# Patient Record
Sex: Female | Born: 1975 | Hispanic: No | Marital: Single
Health system: Western US, Academic
[De-identification: ages and names within clinical notes are randomized; demographics above are authoritative.]

## PROBLEM LIST (undated history)

## (undated) DIAGNOSIS — R87619 Unspecified abnormal cytological findings in specimens from cervix uteri: Secondary | ICD-10-CM

## (undated) DIAGNOSIS — E041 Nontoxic single thyroid nodule: Secondary | ICD-10-CM

## (undated) DIAGNOSIS — IMO0002 Reserved for concepts with insufficient information to code with codable children: Secondary | ICD-10-CM

## (undated) DIAGNOSIS — Z87440 Personal history of urinary (tract) infections: Secondary | ICD-10-CM

## (undated) DIAGNOSIS — F32A Depression, unspecified: Secondary | ICD-10-CM

## (undated) DIAGNOSIS — O09529 Supervision of elderly multigravida, unspecified trimester: Secondary | ICD-10-CM

## (undated) DIAGNOSIS — D649 Anemia, unspecified: Secondary | ICD-10-CM

## (undated) DIAGNOSIS — Z8619 Personal history of other infectious and parasitic diseases: Secondary | ICD-10-CM

## (undated) DIAGNOSIS — J302 Other seasonal allergic rhinitis: Secondary | ICD-10-CM

## (undated) DIAGNOSIS — Z8719 Personal history of other diseases of the digestive system: Secondary | ICD-10-CM

## (undated) DIAGNOSIS — K6289 Other specified diseases of anus and rectum: Secondary | ICD-10-CM

## (undated) DIAGNOSIS — F419 Anxiety disorder, unspecified: Secondary | ICD-10-CM

## (undated) DIAGNOSIS — K625 Hemorrhage of anus and rectum: Secondary | ICD-10-CM

## (undated) HISTORY — DX: Personal history of other infectious and parasitic diseases: Z86.19

## (undated) HISTORY — DX: Supervision of elderly multigravida, unspecified trimester: O09.529

## (undated) HISTORY — DX: Personal history of other diseases of the digestive system: Z87.19

## (undated) HISTORY — DX: Anxiety disorder, unspecified: F41.9

## (undated) HISTORY — DX: Unspecified abnormal cytological findings in specimens from cervix uteri: R87.619

## (undated) HISTORY — DX: Depression, unspecified: F32.A

## (undated) HISTORY — DX: Other specified diseases of anus and rectum: K62.89

## (undated) HISTORY — DX: Hemorrhage of anus and rectum: K62.5

## (undated) HISTORY — DX: Anemia, unspecified: D64.9

## (undated) HISTORY — DX: Personal history of urinary (tract) infections: Z87.440

## (undated) HISTORY — DX: Other seasonal allergic rhinitis: J30.2

## (undated) HISTORY — DX: Reserved for concepts with insufficient information to code with codable children: IMO0002

---

## 2005-09-21 HISTORY — PX: WISDOM TOOTH EXTRACTION: SHX21

## 2007-09-22 DIAGNOSIS — E041 Nontoxic single thyroid nodule: Secondary | ICD-10-CM

## 2007-09-22 HISTORY — DX: Nontoxic single thyroid nodule: E04.1

## 2008-04-09 ENCOUNTER — Encounter: Admission: RE | Admit: 2008-04-09 | Discharge: 2008-04-09 | Payer: Self-pay | Admitting: Endocrinology

## 2008-04-25 ENCOUNTER — Other Ambulatory Visit: Admission: RE | Admit: 2008-04-25 | Discharge: 2008-04-25 | Payer: Self-pay | Admitting: Interventional Radiology

## 2008-04-25 ENCOUNTER — Encounter: Admission: RE | Admit: 2008-04-25 | Discharge: 2008-04-25 | Payer: Self-pay | Admitting: Endocrinology

## 2008-04-25 ENCOUNTER — Encounter (INDEPENDENT_AMBULATORY_CARE_PROVIDER_SITE_OTHER): Payer: Self-pay | Admitting: Interventional Radiology

## 2008-10-04 ENCOUNTER — Encounter: Admission: RE | Admit: 2008-10-04 | Discharge: 2008-10-04 | Payer: Self-pay | Admitting: Endocrinology

## 2009-05-11 IMAGING — US US BIOPSY
1 series · 14 of 15 positions shown · non-contrast
Comparison: none

CLINICAL DATA: Cyst like appearing abnormality left thyroid

ULTRASOUND-GUIDED NEEDLE ASPIRATE BIOPSY, LEFT LOBE OF THYROID
The above procedure was discussed with the patient and written
informed consent was obtained.

[Series 1: us biopsy · 0.06mm/px · 15 acquisitions, 14 frames shown]
[im 1/15]
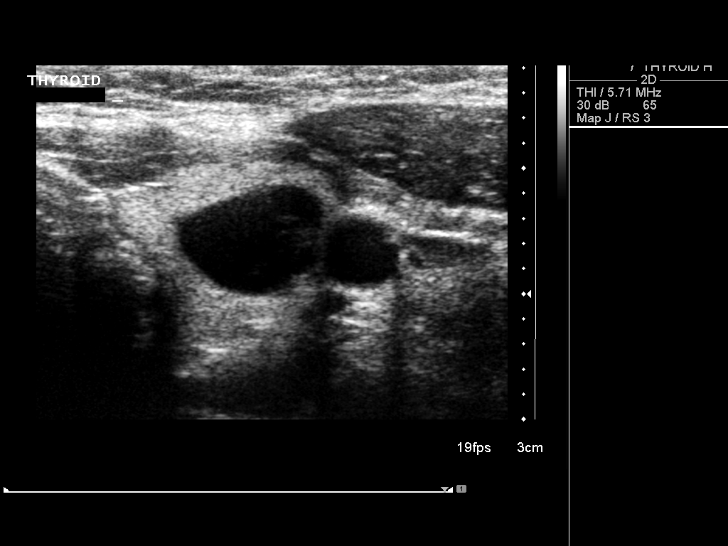
[im 2/15]
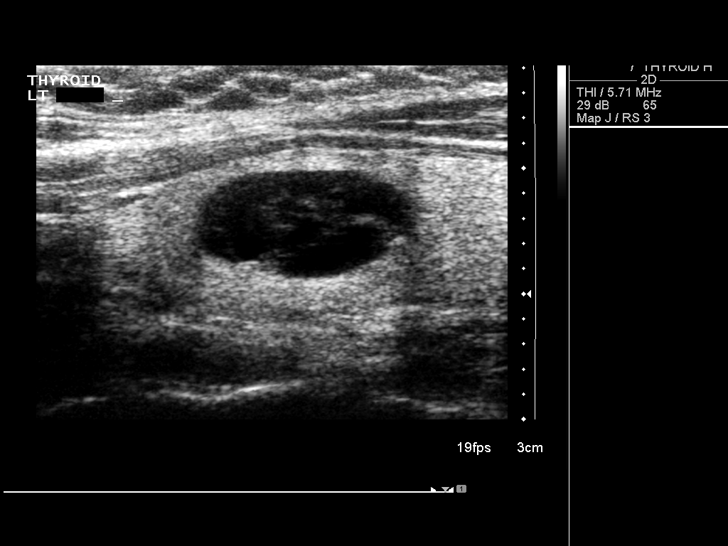
[im 3/15]
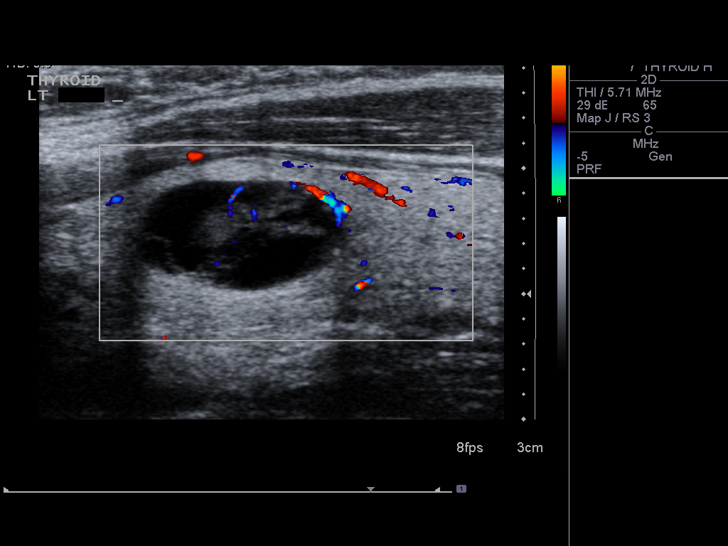
[im 4/15]
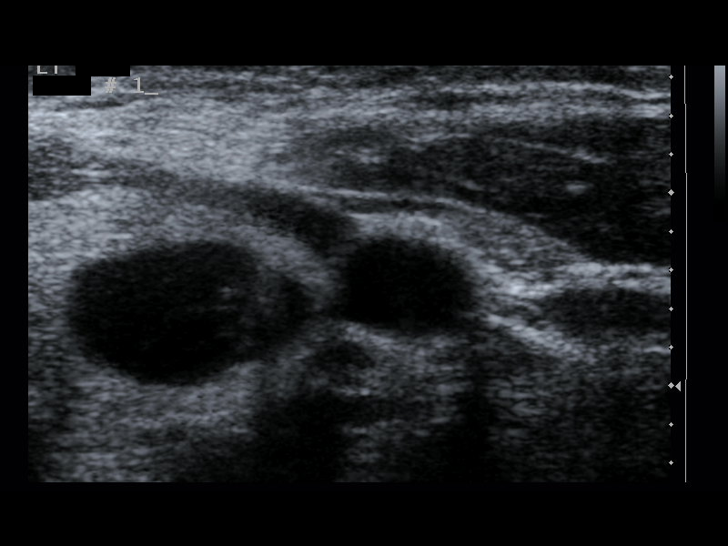
[im 5/15]
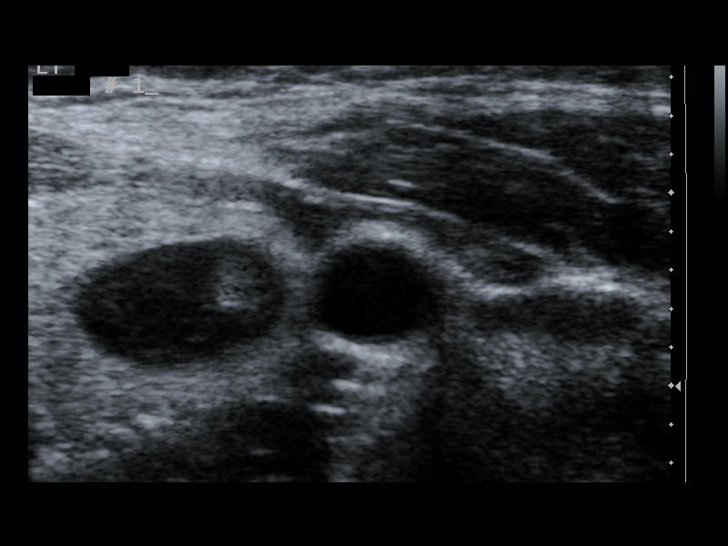
[im 6/15]
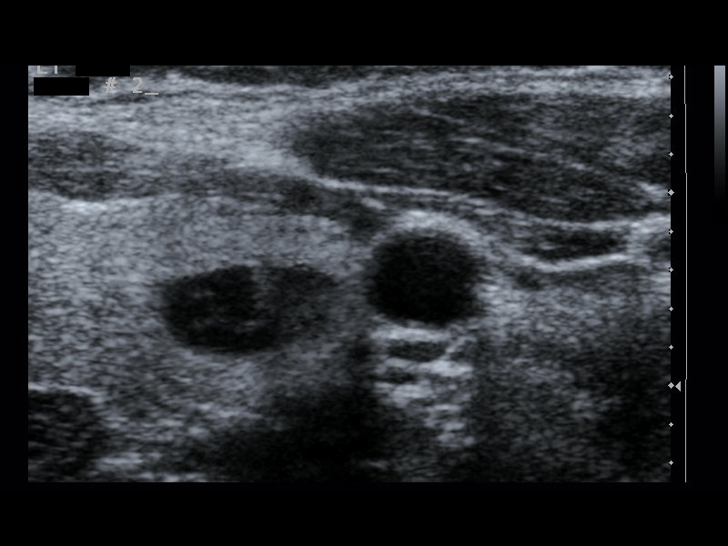
[im 7/15]
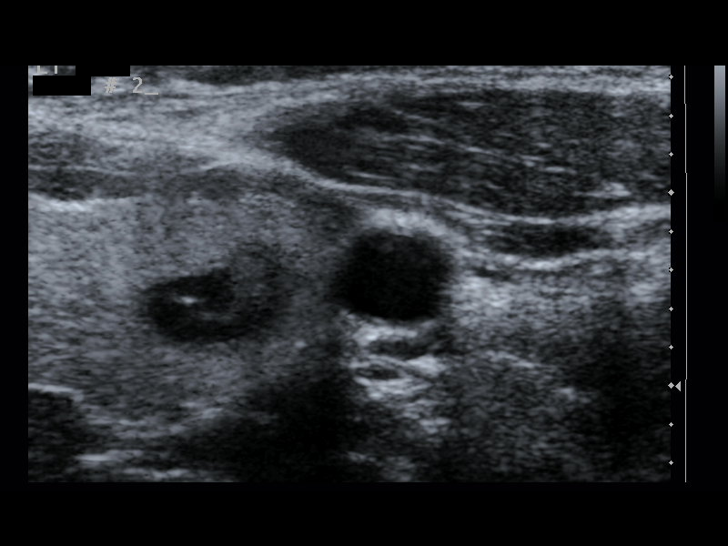
[im 9/15]
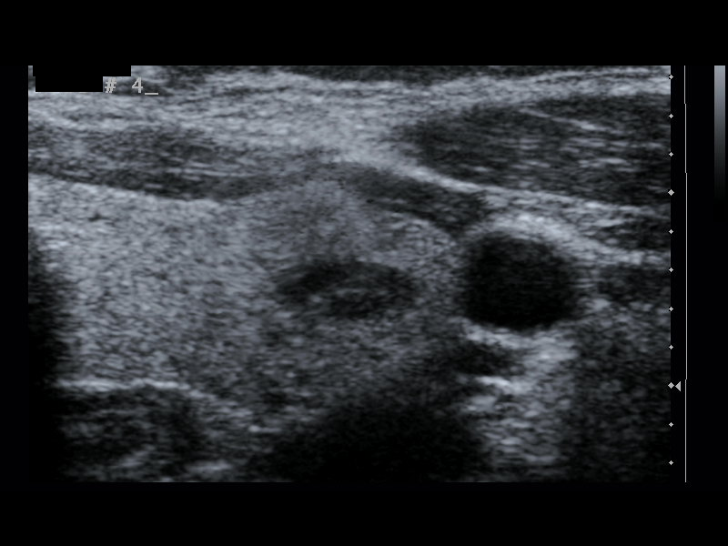
[im 10/15]
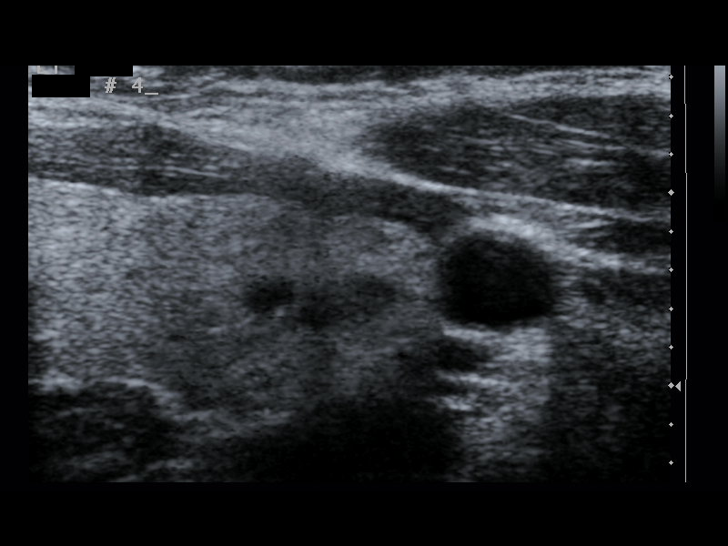
[im 11/15]
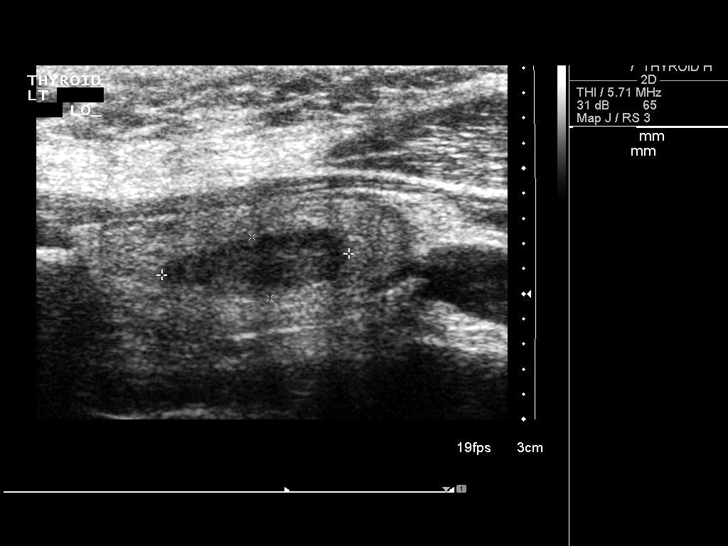
[im 12/15]
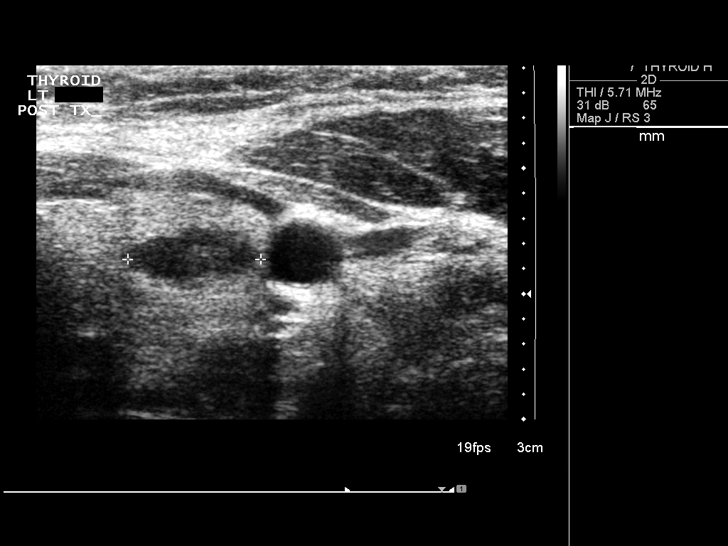
[im 13/15]
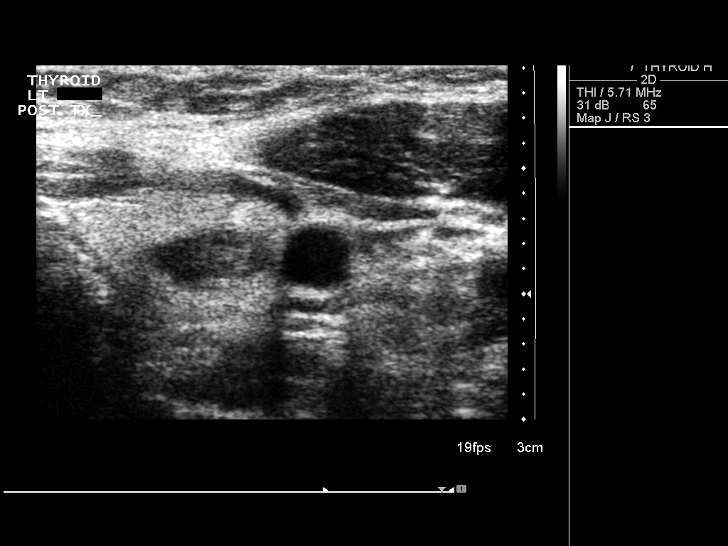
[im 14/15]
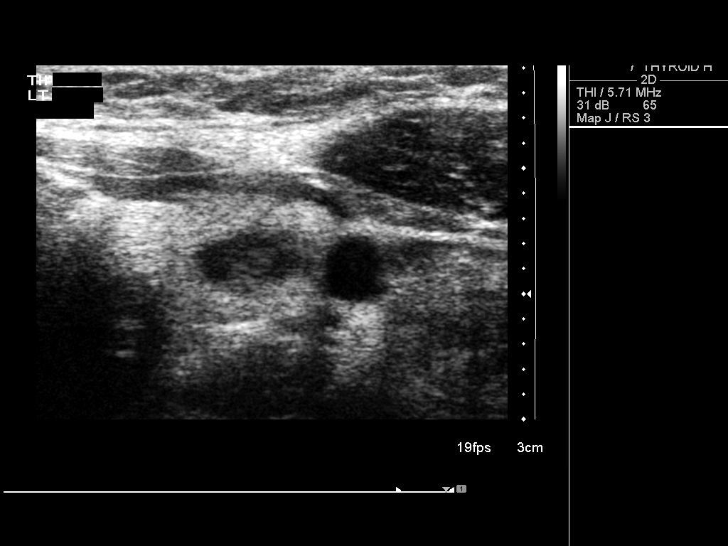
[im 15/15]
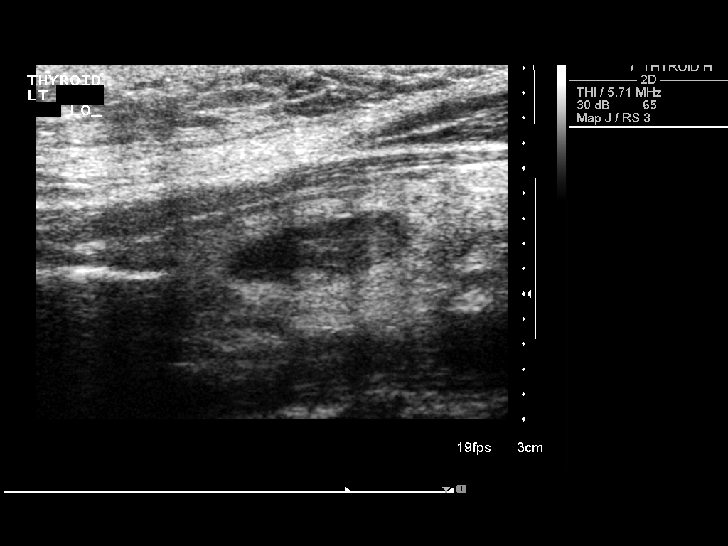

[14 of 15 positions shown; findings below may reference images not displayed]

FINDINGS: Ultrasound was performed to localize and mark an adequate
site for the biopsy.  The patient was then prepped and draped in a
normal sterile fashion.  Local anesthesia was provided with 1%
lidocaine.  Using direct ultrasound guidance, 4 passes were made
using 25 gauge needles into the nodule/cyst within the left lobe of
the thyroid.  Ultrasound was used to confirm needle placements on
all occasions. 2 cc total of brown/bloody fluid was obtained along
with tissue samples. Specimens were sent to Pathology for analysis.
Post procedural imaging demonstrated no hematoma or immediate
complication.  The patient tolerated the procedure well.
IMPRESSION: Successful ultrasound guided needle aspirate biopsy of
cyst like abnormality within the left lobe of the thyroid. Final
pathology pending.

Read by: Stafford, Inocensio.-DANISH

## 2009-07-24 ENCOUNTER — Inpatient Hospital Stay (HOSPITAL_COMMUNITY): Admission: AD | Admit: 2009-07-24 | Discharge: 2009-07-24 | Payer: Self-pay | Admitting: Obstetrics and Gynecology

## 2009-07-25 ENCOUNTER — Inpatient Hospital Stay (HOSPITAL_COMMUNITY): Admission: AD | Admit: 2009-07-25 | Discharge: 2009-07-25 | Payer: Self-pay | Admitting: Obstetrics and Gynecology

## 2009-09-19 ENCOUNTER — Inpatient Hospital Stay (HOSPITAL_COMMUNITY): Admission: AD | Admit: 2009-09-19 | Discharge: 2009-09-20 | Payer: Self-pay | Admitting: Obstetrics and Gynecology

## 2010-10-11 ENCOUNTER — Other Ambulatory Visit: Payer: Self-pay | Admitting: Endocrinology

## 2010-10-11 DIAGNOSIS — E042 Nontoxic multinodular goiter: Secondary | ICD-10-CM

## 2010-10-27 ENCOUNTER — Ambulatory Visit
Admission: RE | Admit: 2010-10-27 | Discharge: 2010-10-27 | Disposition: A | Discharge status: Home / Self Care | Payer: 59 | Source: Ambulatory Visit | Attending: Endocrinology | Admitting: Endocrinology

## 2010-10-27 DIAGNOSIS — E042 Nontoxic multinodular goiter: Secondary | ICD-10-CM

## 2010-12-22 LAB — STREP B DNA PROBE: Strep Group B Ag: NEGATIVE

## 2010-12-22 LAB — CBC
HCT: 31.4 % — ABNORMAL LOW (ref 36.0–46.0)
MCHC: 33.2 g/dL (ref 30.0–36.0)
MCHC: 33.9 g/dL (ref 30.0–36.0)
MCV: 89.7 fL (ref 78.0–100.0)
MCV: 90.7 fL (ref 78.0–100.0)
Platelets: 183 10*3/uL (ref 150–400)
RBC: 4.13 MIL/uL (ref 3.87–5.11)
RDW: 14 % (ref 11.5–15.5)
RDW: 14.6 % (ref 11.5–15.5)
WBC: 10.1 10*3/uL (ref 4.0–10.5)
WBC: 11 10*3/uL — ABNORMAL HIGH (ref 4.0–10.5)

## 2010-12-24 LAB — WET PREP, GENITAL: Yeast Wet Prep HPF POC: NONE SEEN

## 2011-06-30 ENCOUNTER — Encounter (HOSPITAL_COMMUNITY): Payer: Self-pay | Admitting: Anesthesiology

## 2011-06-30 ENCOUNTER — Other Ambulatory Visit: Payer: Self-pay | Admitting: Obstetrics and Gynecology

## 2011-06-30 ENCOUNTER — Ambulatory Visit (HOSPITAL_COMMUNITY)
Admission: RE | Admit: 2011-06-30 | Discharge: 2011-06-30 | Disposition: A | Discharge status: Home / Self Care | Payer: 59 | Source: Ambulatory Visit | Attending: Obstetrics and Gynecology | Admitting: Obstetrics and Gynecology

## 2011-06-30 ENCOUNTER — Ambulatory Visit (HOSPITAL_COMMUNITY): Payer: 59 | Admitting: Anesthesiology

## 2011-06-30 ENCOUNTER — Encounter (HOSPITAL_COMMUNITY): Payer: Self-pay | Admitting: *Deleted

## 2011-06-30 ENCOUNTER — Encounter (HOSPITAL_COMMUNITY)
Admission: RE | Disposition: A | Discharge status: Home / Self Care | Payer: Self-pay | Source: Ambulatory Visit | Attending: Obstetrics and Gynecology

## 2011-06-30 DIAGNOSIS — O021 Missed abortion: Secondary | ICD-10-CM | POA: Insufficient documentation

## 2011-06-30 HISTORY — PX: DILATION AND EVACUATION: SHX1459

## 2011-06-30 HISTORY — DX: Nontoxic single thyroid nodule: E04.1

## 2011-06-30 LAB — CBC
HCT: 37.7 % (ref 36.0–46.0)
Hemoglobin: 12.3 g/dL (ref 12.0–15.0)
MCH: 28.2 pg (ref 26.0–34.0)
MCHC: 32.6 g/dL (ref 30.0–36.0)
MCV: 86.5 fL (ref 78.0–100.0)
RDW: 13.3 % (ref 11.5–15.5)

## 2011-06-30 SURGERY — DILATION AND EVACUATION, UTERUS
Anesthesia: Monitor Anesthesia Care

## 2011-06-30 MED ORDER — LACTATED RINGERS IV SOLN
INTRAVENOUS | Status: DC | PRN
  Administered 2011-06-30: 12:00:00 via INTRAVENOUS

## 2011-06-30 MED ORDER — FENTANYL CITRATE 0.05 MG/ML IJ SOLN
INTRAMUSCULAR | Status: DC | PRN
  Administered 2011-06-30 (×2): 50 ug via INTRAVENOUS

## 2011-06-30 MED ORDER — KETOROLAC TROMETHAMINE 30 MG/ML IJ SOLN
INTRAMUSCULAR | Status: DC | PRN
  Administered 2011-06-30: 30 mg via INTRAVENOUS

## 2011-06-30 MED ORDER — PROPOFOL 10 MG/ML IV EMUL
INTRAVENOUS | Status: DC | PRN
  Administered 2011-06-30: 27 mg via INTRAVENOUS
  Administered 2011-06-30: 25 mg via INTRAVENOUS

## 2011-06-30 MED ORDER — PROPOFOL 10 MG/ML IV EMUL
INTRAVENOUS | Status: AC
  Filled 2011-06-30: qty 20

## 2011-06-30 MED ORDER — ONDANSETRON HCL 4 MG/2ML IJ SOLN
INTRAMUSCULAR | Status: AC
  Filled 2011-06-30: qty 2

## 2011-06-30 MED ORDER — FENTANYL CITRATE 0.05 MG/ML IJ SOLN
INTRAMUSCULAR | Status: AC
  Filled 2011-06-30: qty 2

## 2011-06-30 MED ORDER — LIDOCAINE HCL (CARDIAC) 20 MG/ML IV SOLN
INTRAVENOUS | Status: AC
  Filled 2011-06-30: qty 5

## 2011-06-30 MED ORDER — HYDROCODONE-ACETAMINOPHEN 5-500 MG PO TABS: 1.0000 | ORAL_TABLET | Freq: Four times a day (QID) | ORAL | Status: AC | PRN

## 2011-06-30 MED ORDER — PROPOFOL 10 MG/ML IV EMUL
INTRAVENOUS | Status: DC | PRN
  Administered 2011-06-30: 120 ug/kg/min via INTRAVENOUS

## 2011-06-30 MED ORDER — ONDANSETRON HCL 4 MG/2ML IJ SOLN
INTRAMUSCULAR | Status: DC | PRN
  Administered 2011-06-30: 4 mg via INTRAVENOUS

## 2011-06-30 MED ORDER — LACTATED RINGERS IV SOLN
INTRAVENOUS | Status: DC
  Administered 2011-06-30: 11:00:00 via INTRAVENOUS

## 2011-06-30 MED ORDER — IBUPROFEN 600 MG PO TABS: 600.0000 mg | ORAL_TABLET | Freq: Four times a day (QID) | ORAL | Status: AC | PRN

## 2011-06-30 MED ORDER — MIDAZOLAM HCL 5 MG/5ML IJ SOLN
INTRAMUSCULAR | Status: DC | PRN
  Administered 2011-06-30: 2 mg via INTRAVENOUS

## 2011-06-30 MED ORDER — LIDOCAINE HCL 2 % IJ SOLN
INTRAMUSCULAR | Status: DC | PRN
  Administered 2011-06-30: 10 mL

## 2011-06-30 MED ORDER — MIDAZOLAM HCL 2 MG/2ML IJ SOLN
INTRAMUSCULAR | Status: AC
  Filled 2011-06-30: qty 2

## 2011-06-30 SURGICAL SUPPLY — 22 items
CATH ROBINSON RED A/P 16FR (CATHETERS) ×2 IMPLANT
CLOTH BEACON ORANGE TIMEOUT ST (SAFETY) ×2 IMPLANT
DECANTER SPIKE VIAL GLASS SM (MISCELLANEOUS) ×2 IMPLANT
DRAPE UTILITY XL STRL (DRAPES) ×2 IMPLANT
GLOVE BIO SURGEON STRL SZ7.5 (GLOVE) ×4 IMPLANT
GLOVE BIOGEL PI IND STRL 7.5 (GLOVE) ×1 IMPLANT
GLOVE BIOGEL PI INDICATOR 7.5 (GLOVE) ×1
GOWN PREVENTION PLUS LG XLONG (DISPOSABLE) ×2 IMPLANT
GOWN STRL REIN XL XLG (GOWN DISPOSABLE) ×2 IMPLANT
KIT BERKELEY 1ST TRIMESTER 3/8 (MISCELLANEOUS) ×2 IMPLANT
NEEDLE SPNL 22GX3.5 QUINCKE BK (NEEDLE) ×2 IMPLANT
NS IRRIG 1000ML POUR BTL (IV SOLUTION) ×2 IMPLANT
PACK VAGINAL MINOR WOMEN LF (CUSTOM PROCEDURE TRAY) ×2 IMPLANT
PAD PREP 24X48 CUFFED NSTRL (MISCELLANEOUS) ×2 IMPLANT
SET BERKELEY SUCTION TUBING (SUCTIONS) ×2 IMPLANT
SYR CONTROL 10ML LL (SYRINGE) ×2 IMPLANT
TOWEL OR 17X24 6PK STRL BLUE (TOWEL DISPOSABLE) ×4 IMPLANT
VACURETTE 10 RIGID CVD (CANNULA) IMPLANT
VACURETTE 7MM CVD STRL WRAP (CANNULA) IMPLANT
VACURETTE 8 RIGID CVD (CANNULA) IMPLANT
VACURETTE 9 RIGID CVD (CANNULA) IMPLANT
WATER STERILE IRR 1000ML POUR (IV SOLUTION) ×2 IMPLANT

## 2011-06-30 NOTE — Anesthesia Postprocedure Evaluation (Signed)
  Anesthesia Post-op Note  Patient: Shannon Mccarty  Procedure(s) Performed:  DILATATION AND EVACUATION (D&E)  Patient is awake and responsive. Pain and nausea are reasonably well controlled. Vital signs are stable and clinically acceptable. Oxygen saturation is clinically acceptable. There are no apparent anesthetic complications at this time. Patient is ready for discharge.

## 2011-06-30 NOTE — Op Note (Signed)
Preop Diagnosis: Missed Abortion   Postop Diagnosis: Missed Abortion   Procedure: DILATATION AND EVACUATION (D&E)   Anesthesia: MAC and Paracervical Block   Attending: Purcell Nails, MD   Assistant: N/a  Findings: Moderate POCs  Pathology: POCs  Fluids: 1000cc  UOP: QS via straigh cath prior to procedure approx. 100cc  EBL: Minimal  Complications: None   Procedure: The patient was taken to the operating room after the risks benefits and alternatives were discussed with the patient, the patient verbalized understanding and consent signed and witnessed.  The patient was placed under MAC anesthesia, prepped and draped in the normal sterile fashion and a time out was performed.  A bivalve speculum was placed in the patient's vagina and the anterior lip of the cervix grasped with a single-tooth tenaculum. A paracervical block was administered using a total of 10 cc of 2% lidocaine.  The uterus was sounded to 10 cm and a size 9 suction curette was used. Suction curettage was performed until minimal tissue returned. Sharp curettage was performed until a gritty texture was noted. Suction curettage was performed once again to remove any remaining debris. All instruments were removed. The count was correct. The patient was transferred to the recovery room in good condition.

## 2011-06-30 NOTE — Anesthesia Postprocedure Evaluation (Signed)
Anesthesia Post Note  Patient: Shannon Mccarty  Procedure(s) Performed:  DILATATION AND EVACUATION (D&E)  Anesthesia type: MAC  Patient location: PACU  Post pain: Pain level controlled  Post assessment: Post-op Vital signs reviewed  Last Vitals:  Filed Vitals:   06/30/11 1300  BP: 97/56  Pulse: 76  Temp:   Resp: 19    Post vital signs: Reviewed  Level of consciousness: sedated  Complications: No apparent anesthesia complications

## 2011-06-30 NOTE — Anesthesia Preprocedure Evaluation (Addendum)
Anesthesia Evaluation  Name, MR# and DOB Patient awake  General Assessment Comment  Reviewed: Allergy & Precautions, H&P , Patient's Chart, lab work & pertinent test results, reviewed documented beta blocker date and time   Airway Mallampati: II TM Distance: >3 FB Neck ROM: full    Dental No notable dental hx.    Pulmonary  clear to auscultation  Pulmonary exam normal       Cardiovascular regular Normal    Neuro/Psych    GI/Hepatic   Endo/Other    Renal/GU      Musculoskeletal   Abdominal   Peds  Hematology   Anesthesia Other Findings   Reproductive/Obstetrics                           Anesthesia Physical Anesthesia Plan  ASA: I  Anesthesia Plan: MAC   Post-op Pain Management:    Induction: Intravenous  Airway Management Planned: Mask  Additional Equipment:   Intra-op Plan:   Post-operative Plan:   Informed Consent: I have reviewed the patients History and Physical, chart, labs and discussed the procedure including the risks, benefits and alternatives for the proposed anesthesia with the patient or authorized representative who has indicated his/her understanding and acceptance.   Dental Advisory Given  Plan Discussed with: CRNA and Surgeon  Anesthesia Plan Comments: (  Discussed possibility of general anesthesia, including possible nausea, instrumentation of airway if necessary, sore throat,pulmonary aspiration, etc.  I asked if the were any outstanding questions, or concerns needing to be addressed before we proceeded. )        Anesthesia Quick Evaluation

## 2011-06-30 NOTE — Transfer of Care (Signed)
Immediate Anesthesia Transfer of Care Note  Patient: Shannon Mccarty  Procedure(s) Performed:  DILATATION AND EVACUATION (D&E)  Patient Location: PACU  Anesthesia Type: MAC  Level of Consciousness: awake, alert  and oriented  Airway & Oxygen Therapy: Patient Spontanous Breathing  Post-op Assessment: Report given to PACU RN and Post -op Vital signs reviewed and stable  Post vital signs: Reviewed and stable  Complications: No apparent anesthesia complications

## 2011-06-30 NOTE — H&P (Signed)
Shannon Mccarty is a 35 y.o.MW female presenting at 10.6 weeks  for scheduled D&E secondary to Missed AB.  In office yesterday, no FHT via u/s and fetal pole measuring 7 weeks.  Pt denies VB, LOF, abnl d/c.  No significant lower abdominal pains.  No recent illnesses, however her two daughters have been sick recently.  Pt was at office yesterday for her NOB work-up when diagnosis made.  CBC and thyroid panel drawn yesterday at office.  Pt is G3P1102.   Maternal Medical History:  Prenatal complications: 1.  AMA 2.  H/o anemia 3.  H/o thyroid nodules in past--seen by Dr. Talmage Nap in past:  Thyroid panel drawn at office yesterday 4.  H/o of PTD last pregnancy at 36 weeks 5.  H/o PTL with 1st preg, delivery at 37 weeks    OB History    Grav Para Term Preterm Abortions TAB SAB Ect Mult Living                 Past Medical History  Diagnosis Date  . Thyroid nodule 2009   Past Surgical History  Procedure Date  . Wisdom tooth extraction 2008   Family History: family history is not on file.Noncontributory. Social History:  reports that she has never smoked. She has never used smokeless tobacco. She reports that she does not drink alcohol or use illicit drugs.  Review of Systems  Constitutional: Negative.   Eyes: Negative.   Respiratory: Negative.   Cardiovascular: Negative.   Gastrointestinal: Negative.   Genitourinary: Negative.   Skin: Negative.       Blood pressure 81/66, pulse 65, temperature 98.2 F (36.8 C), temperature source Oral, resp. rate 18, height 5\' 3"  (1.6 m), weight 68.947 kg (152 lb), SpO2 100.00%. Maternal Exam:  Introitus: not evaluated.   Cervix: not evaluated.   Physical Exam  Constitutional: She is oriented to person, place, and time. She appears well-developed and well-nourished.  Cardiovascular: Normal rate and regular rhythm.   Respiratory: Effort normal and breath sounds normal.  GI: Soft. Bowel sounds are normal.  Musculoskeletal: Normal range of motion.  She exhibits no edema.  Neurological: She is alert and oriented to person, place, and time. She has normal reflexes.  Skin: Skin is warm and dry.    Prenatal labs: ABO, Rh:  A positive Antibody:   Rubella:   RPR:    HBsAg:    HIV:    GBS:     Assessment/Plan: 1.  MAB 2.  10.6 weeks by LMP 3.  Rh positive 4.  AMA 5.  Fetal pole measuring 7 weeks on u/s in office yesterday with no FHR  1.  Admit to [facility] for outpatient procedure with Dr. Su Hilt as attending 2.  Routine preop orders 3.  Pt given options yesterday in office of expectant management, cytotec induction, or D&E; R/B/A rev'd for all these, and after discussion, desired to proceed with D&E. 4.  Dr. Su Hilt to follow   Antonietta Breach 06/30/2011, 11:15 AM  Agree with above - AYR

## 2011-07-01 ENCOUNTER — Encounter (HOSPITAL_COMMUNITY): Payer: Self-pay | Admitting: Obstetrics and Gynecology

## 2011-07-23 DEATH — deceased

## 2011-09-10 ENCOUNTER — Other Ambulatory Visit: Payer: Self-pay | Admitting: Endocrinology

## 2011-09-10 DIAGNOSIS — E042 Nontoxic multinodular goiter: Secondary | ICD-10-CM

## 2011-09-22 NOTE — L&D Delivery Note (Signed)
Delivery Note Precipitous delivery at home at 2:42 AM; a viable female "Lissa Morales" was delivered by a first responder, and then pt and newborn transferred via EMS to [facility].  (Presentation: ;unsure).  APGAR: unsure; weight 8 lb 8 oz (3856 g).   Placenta status: Spontaneous, by EMS; trailing membranes noted at introitus and removed on arrival.  Cord:  with the following complications:none per pt recall .  Cord pH: not collected.  Anesthesia:  None; Local at [facility] Episiotomy: none Lacerations: 2nd degree;Perineal Suture Repair: 3-0 monocryl Est. Blood Loss (mL): since at hospital; unsure prior to arrival.  Pt GBS pos and no prophylaxis, so will CTO newborn closely. Pt has done skin-to-skin and breastfed in ambulance en route. Plan inpatient circ. Mom to postpartum.  Baby to nursery-stable.  Deshanti Adcox H 05/24/2012, 4:45 AM

## 2011-11-02 ENCOUNTER — Ambulatory Visit
Admission: RE | Admit: 2011-11-02 | Discharge: 2011-11-02 | Disposition: A | Discharge status: Home / Self Care | Payer: 59 | Source: Ambulatory Visit | Attending: Endocrinology | Admitting: Endocrinology

## 2011-11-02 DIAGNOSIS — E042 Nontoxic multinodular goiter: Secondary | ICD-10-CM

## 2011-12-03 ENCOUNTER — Encounter (INDEPENDENT_AMBULATORY_CARE_PROVIDER_SITE_OTHER): Payer: 59

## 2011-12-03 DIAGNOSIS — Z3480 Encounter for supervision of other normal pregnancy, unspecified trimester: Secondary | ICD-10-CM

## 2011-12-03 LAB — OB RESULTS CONSOLE ABO/RH: RH Type: POSITIVE

## 2011-12-03 LAB — OB RESULTS CONSOLE HEPATITIS B SURFACE ANTIGEN: Hepatitis B Surface Ag: NEGATIVE

## 2011-12-03 LAB — OB RESULTS CONSOLE HGB/HCT, BLOOD
HCT: 38 %
Hemoglobin: 11.7 g/dL

## 2011-12-03 LAB — OB RESULTS CONSOLE GC/CHLAMYDIA: Chlamydia: NEGATIVE

## 2011-12-03 LAB — OB RESULTS CONSOLE VARICELLA ZOSTER ANTIBODY, IGG: Varicella: IMMUNE

## 2011-12-03 LAB — OB RESULTS CONSOLE RPR: RPR: NONREACTIVE

## 2011-12-22 ENCOUNTER — Other Ambulatory Visit: Payer: Self-pay | Admitting: Obstetrics and Gynecology

## 2011-12-22 DIAGNOSIS — Z3689 Encounter for other specified antenatal screening: Secondary | ICD-10-CM

## 2012-01-08 ENCOUNTER — Ambulatory Visit (INDEPENDENT_AMBULATORY_CARE_PROVIDER_SITE_OTHER): Payer: 59

## 2012-01-08 ENCOUNTER — Ambulatory Visit (INDEPENDENT_AMBULATORY_CARE_PROVIDER_SITE_OTHER): Payer: 59 | Admitting: Obstetrics and Gynecology

## 2012-01-08 ENCOUNTER — Encounter: Payer: Self-pay | Admitting: Obstetrics and Gynecology

## 2012-01-08 VITALS — BP 102/66 | Wt 161.0 lb

## 2012-01-08 DIAGNOSIS — O09529 Supervision of elderly multigravida, unspecified trimester: Secondary | ICD-10-CM

## 2012-01-08 DIAGNOSIS — Z3480 Encounter for supervision of other normal pregnancy, unspecified trimester: Secondary | ICD-10-CM

## 2012-01-08 DIAGNOSIS — Z3689 Encounter for other specified antenatal screening: Secondary | ICD-10-CM

## 2012-01-08 DIAGNOSIS — O09219 Supervision of pregnancy with history of pre-term labor, unspecified trimester: Secondary | ICD-10-CM

## 2012-01-08 DIAGNOSIS — E041 Nontoxic single thyroid nodule: Secondary | ICD-10-CM

## 2012-01-08 LAB — US OB COMP + 14 WK

## 2012-01-08 NOTE — Progress Notes (Signed)
Doing well.  Excited regarding baby boy on [place] today! Reviewed hx of PTL/PTD.  Declines 17P. Would do FFN and betamethasone if PTL s/s occurred.  [place] today:  19 w 5d, EDC 05/29/12, SIUP, female.  Cervix 4.31 cm.  Normal fluid.  Limited anatomy of spine and extremities. Anterior placenta.  EDC by LMP and 1st trimester [place] 06/05/12 (18 5/7 weeks).  Will repeat US at [facility] to complete anatomy. Declines AFP. Harmony WNL.

## 2012-01-25 ENCOUNTER — Encounter: Payer: Self-pay | Admitting: Obstetrics and Gynecology

## 2012-01-29 ENCOUNTER — Encounter: Payer: Self-pay | Admitting: Obstetrics and Gynecology

## 2012-02-01 ENCOUNTER — Encounter: Payer: Self-pay | Admitting: Obstetrics and Gynecology

## 2012-02-01 ENCOUNTER — Ambulatory Visit: Payer: 59

## 2012-02-01 ENCOUNTER — Ambulatory Visit (INDEPENDENT_AMBULATORY_CARE_PROVIDER_SITE_OTHER): Payer: 59 | Admitting: Obstetrics and Gynecology

## 2012-02-01 VITALS — BP 102/60 | Wt 169.0 lb

## 2012-02-01 DIAGNOSIS — Z3480 Encounter for supervision of other normal pregnancy, unspecified trimester: Secondary | ICD-10-CM

## 2012-02-01 DIAGNOSIS — Z8751 Personal history of pre-term labor: Secondary | ICD-10-CM

## 2012-02-01 DIAGNOSIS — Z331 Pregnant state, incidental: Secondary | ICD-10-CM

## 2012-02-01 NOTE — Progress Notes (Signed)
The patient has a history of preterm labor. She does sometimes have pelvic pressure and sometimes she has back pain.  She is uncertain about when or if she should be concerned.  Bed rest was required with her prior pregnancies.  A long discussion was held about 17 P and vaginal progesterone.  The risk and benefits were outlined.  I recommended that the patient begin 17 P.  We will perform an ultrasound in 2 weeks to measure the cervix.  She will give [place] her final decision at that time.  Dr. Stefano Gaul

## 2012-02-05 ENCOUNTER — Encounter: Payer: 59 | Admitting: Obstetrics and Gynecology

## 2012-02-05 ENCOUNTER — Other Ambulatory Visit: Payer: 59

## 2012-02-16 ENCOUNTER — Encounter: Payer: 59 | Admitting: Obstetrics and Gynecology

## 2012-02-16 ENCOUNTER — Other Ambulatory Visit: Payer: 59

## 2012-02-19 ENCOUNTER — Ambulatory Visit (INDEPENDENT_AMBULATORY_CARE_PROVIDER_SITE_OTHER): Payer: 59 | Admitting: Obstetrics and Gynecology

## 2012-02-19 ENCOUNTER — Encounter: Payer: 59 | Admitting: Obstetrics and Gynecology

## 2012-02-19 ENCOUNTER — Other Ambulatory Visit: Payer: Self-pay | Admitting: Obstetrics and Gynecology

## 2012-02-19 ENCOUNTER — Ambulatory Visit (INDEPENDENT_AMBULATORY_CARE_PROVIDER_SITE_OTHER): Payer: 59

## 2012-02-19 ENCOUNTER — Other Ambulatory Visit: Payer: 59

## 2012-02-19 ENCOUNTER — Encounter: Payer: Self-pay | Admitting: Obstetrics and Gynecology

## 2012-02-19 VITALS — BP 118/58 | Wt 174.0 lb

## 2012-02-19 DIAGNOSIS — Z8751 Personal history of pre-term labor: Secondary | ICD-10-CM

## 2012-02-19 DIAGNOSIS — Z331 Pregnant state, incidental: Secondary | ICD-10-CM

## 2012-02-19 DIAGNOSIS — N870 Mild cervical dysplasia: Secondary | ICD-10-CM

## 2012-02-19 LAB — US OB FOLLOW UP

## 2012-02-19 NOTE — Progress Notes (Signed)
Pt without c/o.   H/O PTL pt declined 17 p.  Recheck [place] in 4 weeks for cervical length CIN1 repeat pap in July EFW:  897grams   2-2#,   77% AFI:17.9cm Positiontvs Placenta location: anterior, grade 0 glucola at [facility] in two weeks

## 2012-02-19 NOTE — Patient Instructions (Signed)
Patient Education Materials to be provided at check out (*indicates is located in accordion folder):  Easing Back Pain During Pregnancy  

## 2012-03-04 ENCOUNTER — Other Ambulatory Visit: Payer: 59

## 2012-03-04 ENCOUNTER — Encounter: Payer: Self-pay | Admitting: Obstetrics and Gynecology

## 2012-03-04 ENCOUNTER — Other Ambulatory Visit: Payer: Self-pay | Admitting: Obstetrics and Gynecology

## 2012-03-04 ENCOUNTER — Ambulatory Visit (INDEPENDENT_AMBULATORY_CARE_PROVIDER_SITE_OTHER): Payer: 59 | Admitting: Obstetrics and Gynecology

## 2012-03-04 VITALS — BP 90/58 | Wt 174.0 lb

## 2012-03-04 DIAGNOSIS — E041 Nontoxic single thyroid nodule: Secondary | ICD-10-CM

## 2012-03-04 DIAGNOSIS — O09219 Supervision of pregnancy with history of pre-term labor, unspecified trimester: Secondary | ICD-10-CM

## 2012-03-04 DIAGNOSIS — Z331 Pregnant state, incidental: Secondary | ICD-10-CM

## 2012-03-04 NOTE — Progress Notes (Signed)
[redacted]w[redacted]d Some BH feeling "uncomfortable" w wgt of pregnancy at times no pain or cramping, no d/c or LOF rv'd PTL sx's and [facility] 1hr gtt drawn today [place] for cervical length [place] Needs TSH for hx thyroid nodule Consider FFN? And BMZ if appropriate

## 2012-03-04 NOTE — Progress Notes (Signed)
1 gtt given today without difficulty  

## 2012-03-04 NOTE — Patient Instructions (Addendum)
Fetal Movement Counts Patient Name: __________________________________________________ Patient Due Date: ____________________ Kick counts is highly recommended in high risk pregnancies, but it is a good idea for every pregnant woman to do. Start counting fetal movements at 28 weeks of the pregnancy. Fetal movements increase after eating a full meal or eating or drinking something sweet (the blood sugar is higher). It is also important to drink plenty of fluids (well hydrated) before doing the count. Lie on your left side because it helps with the circulation or you can sit in a comfortable chair with your arms over your belly (abdomen) with no distractions around you. DOING THE COUNT  Try to do the count the same time of day each time you do it.   Mark the day and time, then see how long it takes for you to feel 10 movements (kicks, flutters, swishes, rolls). You should have at least 10 movements within 2 hours. You will most likely feel 10 movements in much less than 2 hours. If you do not, wait an hour and count again. After a couple of days you will see a pattern.   What you are looking for is a change in the pattern or not enough counts in 2 hours. Is it taking longer in time to reach 10 movements?  SEEK MEDICAL CARE IF:  You feel less than 10 counts in 2 hours. Tried twice.   No movement in one hour.   The pattern is changing or taking longer each day to reach 10 counts in 2 hours.   You feel the baby is not moving as it usually does.  Date: ____________ Movements: ____________ Start time: ____________ Finish time: ____________  Date: ____________ Movements: ____________ Start time: ____________ Finish time: ____________ Date: ____________ Movements: ____________ Start time: ____________ Finish time: ____________ Date: ____________ Movements: ____________ Start time: ____________ Finish time: ____________ Date: ____________ Movements: ____________ Start time: ____________ Finish time:  ____________ Date: ____________ Movements: ____________ Start time: ____________ Finish time: ____________ Date: ____________ Movements: ____________ Start time: ____________ Finish time: ____________ Date: ____________ Movements: ____________ Start time: ____________ Finish time: ____________  Date: ____________ Movements: ____________ Start time: ____________ Finish time: ____________ Date: ____________ Movements: ____________ Start time: ____________ Finish time: ____________ Date: ____________ Movements: ____________ Start time: ____________ Finish time: ____________ Date: ____________ Movements: ____________ Start time: ____________ Finish time: ____________ Date: ____________ Movements: ____________ Start time: ____________ Finish time: ____________ Date: ____________ Movements: ____________ Start time: ____________ Finish time: ____________ Date: ____________ Movements: ____________ Start time: ____________ Finish time: ____________  Date: ____________ Movements: ____________ Start time: ____________ Finish time: ____________ Date: ____________ Movements: ____________ Start time: ____________ Finish time: ____________ Date: ____________ Movements: ____________ Start time: ____________ Finish time: ____________ Date: ____________ Movements: ____________ Start time: ____________ Finish time: ____________ Date: ____________ Movements: ____________ Start time: ____________ Finish time: ____________ Date: ____________ Movements: ____________ Start time: ____________ Finish time: ____________ Date: ____________ Movements: ____________ Start time: ____________ Finish time: ____________  Date: ____________ Movements: ____________ Start time: ____________ Finish time: ____________ Date: ____________ Movements: ____________ Start time: ____________ Finish time: ____________ Date: ____________ Movements: ____________ Start time: ____________ Finish time: ____________ Date: ____________ Movements:  ____________ Start time: ____________ Finish time: ____________ Date: ____________ Movements: ____________ Start time: ____________ Finish time: ____________ Date: ____________ Movements: ____________ Start time: ____________ Finish time: ____________ Date: ____________ Movements: ____________ Start time: ____________ Finish time: ____________  Date: ____________ Movements: ____________ Start time: ____________ Finish time: ____________ Date: ____________ Movements: ____________ Start time: ____________ Finish time: ____________ Date: ____________ Movements: ____________ Start time:   ____________ Finish time: ____________ Date: ____________ Movements: ____________ Start time: ____________ Finish time: ____________ Date: ____________ Movements: ____________ Start time: ____________ Finish time: ____________ Date: ____________ Movements: ____________ Start time: ____________ Finish time: ____________ Date: ____________ Movements: ____________ Start time: ____________ Finish time: ____________  Date: ____________ Movements: ____________ Start time: ____________ Finish time: ____________ Date: ____________ Movements: ____________ Start time: ____________ Finish time: ____________ Date: ____________ Movements: ____________ Start time: ____________ Finish time: ____________ Date: ____________ Movements: ____________ Start time: ____________ Finish time: ____________ Date: ____________ Movements: ____________ Start time: ____________ Finish time: ____________ Date: ____________ Movements: ____________ Start time: ____________ Finish time: ____________ Date: ____________ Movements: ____________ Start time: ____________ Finish time: ____________  Date: ____________ Movements: ____________ Start time: ____________ Finish time: ____________ Date: ____________ Movements: ____________ Start time: ____________ Finish time: ____________ Date: ____________ Movements: ____________ Start time: ____________ Finish  time: ____________ Date: ____________ Movements: ____________ Start time: ____________ Finish time: ____________ Date: ____________ Movements: ____________ Start time: ____________ Finish time: ____________ Date: ____________ Movements: ____________ Start time: ____________ Finish time: ____________ Date: ____________ Movements: ____________ Start time: ____________ Finish time: ____________  Date: ____________ Movements: ____________ Start time: ____________ Finish time: ____________ Date: ____________ Movements: ____________ Start time: ____________ Finish time: ____________ Date: ____________ Movements: ____________ Start time: ____________ Finish time: ____________ Date: ____________ Movements: ____________ Start time: ____________ Finish time: ____________ Date: ____________ Movements: ____________ Start time: ____________ Finish time: ____________ Date: ____________ Movements: ____________ Start time: ____________ Finish time: ____________ Document Released: 10/07/2006 Document Revised: 08/27/2011 Document Reviewed: 04/09/2009 Exit[employer] Patient Information 2012 [employer].  Preventing Preterm Labor Preterm labor is when a pregnant woman has contractions that cause the cervix to open, shorten, and thin before 37 weeks of pregnancy. You will have regular contractions (tightening) 2 to 3 minutes apart. This usually causes discomfort or pain. HOME CARE  Eat a healthy diet.   Take your vitamins as told by your doctor.   Drink enough fluids to keep your pee (urine) clear or pale yellow every day.   Get rest and sleep.   Do not have sex if you are at high risk for preterm labor.   Follow your doctor's advice about activity, medicines, and tests.   Avoid stress.   Avoid hard labor or exercise that lasts for a long time.   Do not smoke.  GET HELP RIGHT AWAY IF:   You are having contractions.   You have belly (abdominal) pain.   You have bleeding from your vagina.   You  have pain when you pee (urinate).   You have abnormal discharge from your vagina.   You have a temperature by mouth above 102 F (38.9 C).  MAKE SURE YOU:  Understand these instructions.   Will watch your condition.   Will get help if you are not doing well or get worse.  Document Released: 12/04/2008 Document Revised: 08/27/2011 Document Reviewed: 12/04/2008 Exit[employer] Patient Information 2012 [employer]. 

## 2012-03-05 LAB — GLUCOSE TOLERANCE, 1 HOUR: Glucose, 1 Hour GTT: 151 mg/dL — ABNORMAL HIGH (ref 70–140)

## 2012-03-05 LAB — RPR

## 2012-03-07 ENCOUNTER — Telehealth: Payer: Self-pay | Admitting: Obstetrics and Gynecology

## 2012-03-07 ENCOUNTER — Other Ambulatory Visit: Payer: Self-pay | Admitting: Obstetrics and Gynecology

## 2012-03-07 DIAGNOSIS — O99810 Abnormal glucose complicating pregnancy: Secondary | ICD-10-CM

## 2012-03-07 NOTE — Telephone Encounter (Signed)
Pt out of town week of 03-07-12.  Pt needs 3 hr GTT.  Sch pt for 03-18-12 @8 :30, mailed diet and appt info to pt.

## 2012-03-15 ENCOUNTER — Ambulatory Visit (INDEPENDENT_AMBULATORY_CARE_PROVIDER_SITE_OTHER): Payer: 59 | Admitting: Obstetrics and Gynecology

## 2012-03-15 ENCOUNTER — Telehealth: Payer: Self-pay | Admitting: Obstetrics and Gynecology

## 2012-03-15 ENCOUNTER — Encounter: Payer: Self-pay | Admitting: Obstetrics and Gynecology

## 2012-03-15 ENCOUNTER — Telehealth: Payer: Self-pay

## 2012-03-15 ENCOUNTER — Ambulatory Visit (INDEPENDENT_AMBULATORY_CARE_PROVIDER_SITE_OTHER): Payer: 59

## 2012-03-15 VITALS — BP 108/56 | Wt 175.0 lb

## 2012-03-15 DIAGNOSIS — O09219 Supervision of pregnancy with history of pre-term labor, unspecified trimester: Secondary | ICD-10-CM

## 2012-03-15 DIAGNOSIS — O26879 Cervical shortening, unspecified trimester: Secondary | ICD-10-CM

## 2012-03-15 DIAGNOSIS — O0990 Supervision of high risk pregnancy, unspecified, unspecified trimester: Secondary | ICD-10-CM

## 2012-03-15 NOTE — Telephone Encounter (Signed)
17 p inj sch for 03-18-12 per AVS.  Pt also needs rob and u/s for next Tues 03-22-12 according to AVS last visit note.  ld

## 2012-03-15 NOTE — Telephone Encounter (Signed)
Spoke with pt rgd some muscle cramping ?. Pt stated that she sleeps on her left side woke up with muscle pain. Told pt I would consult with vicki. Chip Boer advised pt to take 600 mg of motrin try to sleep on the other side and could take a warm bath.pt stated she has app with vicki this afternoon. Pt's voice understanding .

## 2012-03-15 NOTE — Telephone Encounter (Signed)
Bonnie/epic 

## 2012-03-15 NOTE — Progress Notes (Signed)
C/O of cramping on right side, took 600mg  Ibuprofen for relief Pt starts 3 hr GTT diet today, appt scheduled for Friday.

## 2012-03-15 NOTE — Progress Notes (Signed)
The patient complains of vague abdominal pain.  Her abdomen is nontender.  No masses or hernias are detected.  Discontinue ibuprofen.  Try Tylenol. Ultrasound: Single gestation, normal fluid, vertex, anterior placenta, cervix closed, measures 2.8 cm. Patient has a history of preterm labor.  Cervix measured 3.48 cm on Feb 19, 2012.  The patient will return to the office in 3 days for a 17 P shot. 3 hour GTT in 3 days. Return to office in 1 week.  Ultrasound measures cervix next week.  Betamethasone discussed.  Her cervix continues to stand.  The patient will begin to limit her activity.  No vaginal penetration. Dr. Stefano Gaul

## 2012-03-18 ENCOUNTER — Other Ambulatory Visit: Payer: 59

## 2012-03-18 DIAGNOSIS — O09219 Supervision of pregnancy with history of pre-term labor, unspecified trimester: Secondary | ICD-10-CM

## 2012-03-18 LAB — US OB LIMITED

## 2012-03-18 MED ORDER — HYDROXYPROGESTERONE CAPROATE 250 MG/ML IM OIL
250.0000 mg | TOPICAL_OIL | Freq: Once | INTRAMUSCULAR | Status: AC
  Administered 2012-03-18: 250 mg via INTRAMUSCULAR

## 2012-03-19 LAB — GLUCOSE TOLERANCE, 3 HOURS
Glucose Tolerance, 1 hour: 137 mg/dL (ref 70–189)
Glucose Tolerance, 2 hour: 139 mg/dL (ref 70–164)
Glucose Tolerance, Fasting: 90 mg/dL (ref 70–104)

## 2012-03-22 ENCOUNTER — Encounter: Payer: Self-pay | Admitting: Obstetrics and Gynecology

## 2012-03-22 ENCOUNTER — Ambulatory Visit (INDEPENDENT_AMBULATORY_CARE_PROVIDER_SITE_OTHER): Payer: 59 | Admitting: Obstetrics and Gynecology

## 2012-03-22 ENCOUNTER — Ambulatory Visit (INDEPENDENT_AMBULATORY_CARE_PROVIDER_SITE_OTHER): Payer: 59

## 2012-03-22 VITALS — BP 90/62 | Wt 178.0 lb

## 2012-03-22 DIAGNOSIS — N889 Noninflammatory disorder of cervix uteri, unspecified: Secondary | ICD-10-CM

## 2012-03-22 DIAGNOSIS — O26879 Cervical shortening, unspecified trimester: Secondary | ICD-10-CM | POA: Insufficient documentation

## 2012-03-22 LAB — US OB TRANSVAGINAL

## 2012-03-22 MED ORDER — HYDROXYPROGESTERONE CAPROATE 250 MG/ML IM OIL: 250.0000 mg | TOPICAL_OIL | INTRAMUSCULAR | Status: DC

## 2012-03-22 NOTE — Progress Notes (Signed)
work in u/s today cervical length No concerns per pt

## 2012-03-22 NOTE — Progress Notes (Signed)
Doing well. [place] today for cervical length--per AVS discussion with patient, planned weekly cervical length assessment. [place] = cervix 2.8 cm (stable), no funneling, vtx, left lateral placenta, normal fluid Findings reviewed.   Will continue limited physical activity, 17p weekly on Fridays--wants [place] for cervical length scheduled with 17P. Next 17 p scheduled on 03/25/12.   Next weeks' scan, visit, and 17p on 7/12. 3 hour GTT WNL.

## 2012-03-25 ENCOUNTER — Other Ambulatory Visit (INDEPENDENT_AMBULATORY_CARE_PROVIDER_SITE_OTHER): Payer: 59

## 2012-03-25 DIAGNOSIS — O09219 Supervision of pregnancy with history of pre-term labor, unspecified trimester: Secondary | ICD-10-CM

## 2012-03-25 DIAGNOSIS — O26879 Cervical shortening, unspecified trimester: Secondary | ICD-10-CM

## 2012-03-25 MED ORDER — HYDROXYPROGESTERONE CAPROATE 250 MG/ML IM OIL
250.0000 mg | TOPICAL_OIL | Freq: Once | INTRAMUSCULAR | Status: AC
  Administered 2012-03-25: 250 mg via INTRAMUSCULAR

## 2012-03-31 ENCOUNTER — Encounter: Payer: Self-pay | Admitting: Obstetrics and Gynecology

## 2012-03-31 ENCOUNTER — Ambulatory Visit (INDEPENDENT_AMBULATORY_CARE_PROVIDER_SITE_OTHER): Payer: 59

## 2012-03-31 ENCOUNTER — Ambulatory Visit (INDEPENDENT_AMBULATORY_CARE_PROVIDER_SITE_OTHER): Payer: 59 | Admitting: Obstetrics and Gynecology

## 2012-03-31 VITALS — BP 100/64 | Wt 176.0 lb

## 2012-03-31 DIAGNOSIS — N889 Noninflammatory disorder of cervix uteri, unspecified: Secondary | ICD-10-CM

## 2012-03-31 DIAGNOSIS — O26879 Cervical shortening, unspecified trimester: Secondary | ICD-10-CM

## 2012-03-31 DIAGNOSIS — Z3490 Encounter for supervision of normal pregnancy, unspecified, unspecified trimester: Secondary | ICD-10-CM

## 2012-03-31 DIAGNOSIS — Z331 Pregnant state, incidental: Secondary | ICD-10-CM

## 2012-03-31 DIAGNOSIS — O09219 Supervision of pregnancy with history of pre-term labor, unspecified trimester: Secondary | ICD-10-CM

## 2012-03-31 LAB — US OB FOLLOW UP

## 2012-03-31 MED ORDER — HYDROXYPROGESTERONE CAPROATE 250 MG/ML IM OIL
250.0000 mg | TOPICAL_OIL | Freq: Once | INTRAMUSCULAR | Status: AC
  Administered 2012-03-31: 250 mg via INTRAMUSCULAR

## 2012-03-31 NOTE — Progress Notes (Signed)
[redacted]w[redacted]d 17p injection today USS today: Vx presentation, Anterior placenta, AFI is normal (50th%) Right Fetal Cerebral Right Ventricle = 1.1cms (suggestive mild ventriculomegly), Left Ventricle = 0.84cm  (normal) Cervix: 3.0cms with fundal pressure. No funnelling seen. Consult MFM for Fetal Ventricle issue. (referral made) 17p injection weekly No complaints. Cx no change now for 3 weeks

## 2012-03-31 NOTE — Progress Notes (Signed)
Pt states no concerns today, 17p given.

## 2012-04-01 ENCOUNTER — Other Ambulatory Visit: Payer: 59

## 2012-04-01 ENCOUNTER — Other Ambulatory Visit: Payer: Self-pay | Admitting: Obstetrics and Gynecology

## 2012-04-01 DIAGNOSIS — R9389 Abnormal findings on diagnostic imaging of other specified body structures: Secondary | ICD-10-CM

## 2012-04-01 LAB — US OB TRANSVAGINAL

## 2012-04-04 ENCOUNTER — Telehealth: Payer: Self-pay | Admitting: Obstetrics and Gynecology

## 2012-04-04 ENCOUNTER — Ambulatory Visit (HOSPITAL_COMMUNITY)
Admission: RE | Admit: 2012-04-04 | Discharge: 2012-04-04 | Disposition: A | Discharge status: Home / Self Care | Payer: 59 | Source: Ambulatory Visit | Attending: Obstetrics and Gynecology | Admitting: Obstetrics and Gynecology

## 2012-04-04 ENCOUNTER — Encounter (HOSPITAL_COMMUNITY): Payer: Self-pay

## 2012-04-04 VITALS — BP 117/60 | HR 93 | Wt 176.0 lb

## 2012-04-04 DIAGNOSIS — R9389 Abnormal findings on diagnostic imaging of other specified body structures: Secondary | ICD-10-CM

## 2012-04-04 DIAGNOSIS — O3500X0 Maternal care for (suspected) central nervous system malformation or damage in fetus, unspecified, not applicable or unspecified: Secondary | ICD-10-CM | POA: Insufficient documentation

## 2012-04-04 DIAGNOSIS — E041 Nontoxic single thyroid nodule: Secondary | ICD-10-CM

## 2012-04-04 DIAGNOSIS — O358XX0 Maternal care for other (suspected) fetal abnormality and damage, not applicable or unspecified: Secondary | ICD-10-CM | POA: Insufficient documentation

## 2012-04-04 DIAGNOSIS — Z1389 Encounter for screening for other disorder: Secondary | ICD-10-CM | POA: Insufficient documentation

## 2012-04-04 DIAGNOSIS — O3440 Maternal care for other abnormalities of cervix, unspecified trimester: Secondary | ICD-10-CM | POA: Insufficient documentation

## 2012-04-04 DIAGNOSIS — O09529 Supervision of elderly multigravida, unspecified trimester: Secondary | ICD-10-CM

## 2012-04-04 DIAGNOSIS — Z8751 Personal history of pre-term labor: Secondary | ICD-10-CM | POA: Insufficient documentation

## 2012-04-04 DIAGNOSIS — O350XX0 Maternal care for (suspected) central nervous system malformation in fetus, not applicable or unspecified: Secondary | ICD-10-CM | POA: Insufficient documentation

## 2012-04-04 DIAGNOSIS — O26879 Cervical shortening, unspecified trimester: Secondary | ICD-10-CM

## 2012-04-04 DIAGNOSIS — Z363 Encounter for antenatal screening for malformations: Secondary | ICD-10-CM | POA: Insufficient documentation

## 2012-04-04 NOTE — Progress Notes (Signed)
Patient seen today  for ultrasound appointment.  See full report in [facility].  Alpha Gula, MD  Patient is seen today due to mild  ventriculomegaly that was noted on recent ultrasound.  Per patient report, underwent cell free fetal DNA (Harmony test) that was negative for aneuploidy.  Ultrasound today confirmed this finding.  No cranial calcifications noted.  The spine appears normal.  We briefly discussed work up for ventriculomegaly - including fetal viral infections.  Offerred TORCH titers- the patient will discuss with her husband and possibly undergo testing at follow up.  Single IUP at 33 5/7 weeks Mild bilateral ventriculomegaly noted (1.2-1.2 cm) Borderline/ mild left renal pylectasis Detailed fetal anatomy otherwise within normal limits Fetal growth is appropriate (84th %tile) Normal amniotic fluid volume  Recommend follow up ultrasound in 4 weeks to reevaluate the lateral venticles.

## 2012-04-04 NOTE — Telephone Encounter (Signed)
Pt returned call, had US done @ [facility], wanted to know if she had the Harmony test and/or AFP done.  After looking pt informed Cathlean Sauer was done and per VL note Harmony WNL and AFP was declined, pt voices appreciation.

## 2012-04-04 NOTE — Telephone Encounter (Signed)
Tc to pt regarding msg, lm on vm to call back. 

## 2012-04-04 NOTE — Telephone Encounter (Signed)
TRIAGE/GENERAL QUEST. °

## 2012-04-08 ENCOUNTER — Ambulatory Visit (INDEPENDENT_AMBULATORY_CARE_PROVIDER_SITE_OTHER): Payer: 59 | Admitting: Obstetrics and Gynecology

## 2012-04-08 ENCOUNTER — Other Ambulatory Visit: Payer: 59

## 2012-04-08 VITALS — BP 100/60 | Wt 178.0 lb

## 2012-04-08 DIAGNOSIS — O09219 Supervision of pregnancy with history of pre-term labor, unspecified trimester: Secondary | ICD-10-CM

## 2012-04-08 DIAGNOSIS — O26879 Cervical shortening, unspecified trimester: Secondary | ICD-10-CM

## 2012-04-08 DIAGNOSIS — N1330 Unspecified hydronephrosis: Secondary | ICD-10-CM

## 2012-04-08 DIAGNOSIS — O350XX0 Maternal care for (suspected) central nervous system malformation in fetus, not applicable or unspecified: Secondary | ICD-10-CM

## 2012-04-08 DIAGNOSIS — N2889 Other specified disorders of kidney and ureter: Secondary | ICD-10-CM

## 2012-04-08 DIAGNOSIS — IMO0002 Reserved for concepts with insufficient information to code with codable children: Secondary | ICD-10-CM

## 2012-04-08 DIAGNOSIS — O3500X0 Maternal care for (suspected) central nervous system malformation or damage in fetus, unspecified, not applicable or unspecified: Secondary | ICD-10-CM

## 2012-04-08 MED ORDER — HYDROXYPROGESTERONE CAPROATE 250 MG/ML IM OIL
250.0000 mg | TOPICAL_OIL | Freq: Once | INTRAMUSCULAR | Status: AC
  Administered 2012-04-08: 250 mg via INTRAMUSCULAR

## 2012-04-08 NOTE — Progress Notes (Signed)
Patient seen today for ultrasound appointment. See full report in [facility].  Shannon Gula, MD  Patient is seen today due to mild ventriculomegaly that was noted on recent ultrasound. Per patient report, underwent cell free fetal DNA (Harmony test) that was negative for aneuploidy. Ultrasound today confirmed this finding. No cranial calcifications noted. The spine appears normal. We briefly discussed work up for ventriculomegaly - including fetal viral infections. Offerred TORCH titers- the patient will discuss with her husband and possibly undergo testing at follow up.  Single IUP at 33 5/7 weeks  Mild bilateral ventriculomegaly noted (1.2-1.2 cm)  Borderline/ mild left renal pylectasis  Detailed fetal anatomy otherwise within normal limits  Fetal growth is appropriate (84th %tile)  Normal amniotic fluid volume  Recommend follow up ultrasound in 4 weeks to reevaluate the lateral venticles.   No complaints Travel questions answered FKCs Pt has appt with MFM in 3wks for f/u

## 2012-04-14 ENCOUNTER — Other Ambulatory Visit: Payer: 59

## 2012-04-14 DIAGNOSIS — O09219 Supervision of pregnancy with history of pre-term labor, unspecified trimester: Secondary | ICD-10-CM

## 2012-04-14 MED ORDER — HYDROXYPROGESTERONE CAPROATE 250 MG/ML IM OIL
250.0000 mg | TOPICAL_OIL | Freq: Once | INTRAMUSCULAR | Status: AC
  Administered 2012-04-14: 250 mg via INTRAMUSCULAR

## 2012-04-15 ENCOUNTER — Encounter (HOSPITAL_COMMUNITY): Payer: 59

## 2012-04-22 ENCOUNTER — Ambulatory Visit (INDEPENDENT_AMBULATORY_CARE_PROVIDER_SITE_OTHER): Payer: 59 | Admitting: Obstetrics and Gynecology

## 2012-04-22 ENCOUNTER — Other Ambulatory Visit: Payer: 59

## 2012-04-22 ENCOUNTER — Encounter: Payer: Self-pay | Admitting: Obstetrics and Gynecology

## 2012-04-22 ENCOUNTER — Encounter: Payer: 59 | Admitting: Obstetrics and Gynecology

## 2012-04-22 VITALS — BP 110/64 | Wt 179.0 lb

## 2012-04-22 DIAGNOSIS — O09219 Supervision of pregnancy with history of pre-term labor, unspecified trimester: Secondary | ICD-10-CM

## 2012-04-22 DIAGNOSIS — N870 Mild cervical dysplasia: Secondary | ICD-10-CM

## 2012-04-22 MED ORDER — HYDROXYPROGESTERONE CAPROATE 250 MG/ML IM OIL
250.0000 mg | TOPICAL_OIL | Freq: Once | INTRAMUSCULAR | Status: AC
  Administered 2012-04-22: 250 mg via INTRAMUSCULAR

## 2012-04-22 NOTE — Progress Notes (Signed)
Doing well, with minimal contractions. Received 17 p today. Multiple questions regarding 17p, status of pregnancy. Needs pap with GBS Reviewed sleep issues--will try Unisom or Benedryl. Has f/u US at [facility] 8/12 for f/u of mild bilateral ventriculomegaly, borderline left renal pylectasis. 17p multiple questions Pap with GBS

## 2012-04-22 NOTE — Progress Notes (Signed)
Pt c/o having trouble sleeping at night and also c/o swelling.

## 2012-04-28 ENCOUNTER — Other Ambulatory Visit: Payer: 59

## 2012-04-28 DIAGNOSIS — O09219 Supervision of pregnancy with history of pre-term labor, unspecified trimester: Secondary | ICD-10-CM

## 2012-04-28 MED ORDER — HYDROXYPROGESTERONE CAPROATE 250 MG/ML IM OIL
250.0000 mg | TOPICAL_OIL | Freq: Once | INTRAMUSCULAR | Status: AC
  Administered 2012-04-28: 250 mg via INTRAMUSCULAR

## 2012-05-02 ENCOUNTER — Ambulatory Visit (HOSPITAL_COMMUNITY)
Admission: RE | Admit: 2012-05-02 | Discharge: 2012-05-02 | Disposition: A | Discharge status: Home / Self Care | Payer: 59 | Source: Ambulatory Visit | Attending: Obstetrics and Gynecology | Admitting: Obstetrics and Gynecology

## 2012-05-02 VITALS — BP 107/62 | HR 80 | Wt 180.0 lb

## 2012-05-02 DIAGNOSIS — O3440 Maternal care for other abnormalities of cervix, unspecified trimester: Secondary | ICD-10-CM | POA: Insufficient documentation

## 2012-05-02 DIAGNOSIS — E041 Nontoxic single thyroid nodule: Secondary | ICD-10-CM

## 2012-05-02 DIAGNOSIS — O3500X0 Maternal care for (suspected) central nervous system malformation or damage in fetus, unspecified, not applicable or unspecified: Secondary | ICD-10-CM | POA: Insufficient documentation

## 2012-05-02 DIAGNOSIS — O358XX0 Maternal care for other (suspected) fetal abnormality and damage, not applicable or unspecified: Secondary | ICD-10-CM

## 2012-05-02 DIAGNOSIS — O350XX0 Maternal care for (suspected) central nervous system malformation in fetus, not applicable or unspecified: Secondary | ICD-10-CM | POA: Insufficient documentation

## 2012-05-02 DIAGNOSIS — IMO0002 Reserved for concepts with insufficient information to code with codable children: Secondary | ICD-10-CM

## 2012-05-02 DIAGNOSIS — O09529 Supervision of elderly multigravida, unspecified trimester: Secondary | ICD-10-CM | POA: Insufficient documentation

## 2012-05-02 DIAGNOSIS — Z8751 Personal history of pre-term labor: Secondary | ICD-10-CM | POA: Insufficient documentation

## 2012-05-02 DIAGNOSIS — O26879 Cervical shortening, unspecified trimester: Secondary | ICD-10-CM

## 2012-05-03 ENCOUNTER — Telehealth: Payer: Self-pay | Admitting: Obstetrics and Gynecology

## 2012-05-04 ENCOUNTER — Ambulatory Visit (INDEPENDENT_AMBULATORY_CARE_PROVIDER_SITE_OTHER): Payer: 59 | Admitting: Obstetrics and Gynecology

## 2012-05-04 VITALS — BP 100/68 | Wt 181.0 lb

## 2012-05-04 DIAGNOSIS — O09219 Supervision of pregnancy with history of pre-term labor, unspecified trimester: Secondary | ICD-10-CM

## 2012-05-04 DIAGNOSIS — Z331 Pregnant state, incidental: Secondary | ICD-10-CM

## 2012-05-04 DIAGNOSIS — Z3490 Encounter for supervision of normal pregnancy, unspecified, unspecified trimester: Secondary | ICD-10-CM

## 2012-05-04 MED ORDER — HYDROXYPROGESTERONE CAPROATE 250 MG/ML IM OIL
250.0000 mg | TOPICAL_OIL | Freq: Once | INTRAMUSCULAR | Status: AC
  Administered 2012-05-04: 250 mg via INTRAMUSCULAR

## 2012-05-04 NOTE — Progress Notes (Signed)
Pt stated having some pain with irregular contractions . Pt stated no other issues today .

## 2012-05-04 NOTE — Progress Notes (Signed)
[redacted]w[redacted]d GFM, irregular contractions, no change in D/C GBS and Pap today Last 17 OHP today

## 2012-05-06 ENCOUNTER — Telehealth: Payer: Self-pay | Admitting: Obstetrics and Gynecology

## 2012-05-06 LAB — PAP IG W/ RFLX HPV ASCU

## 2012-05-07 ENCOUNTER — Encounter: Payer: Self-pay | Admitting: Obstetrics and Gynecology

## 2012-05-07 LAB — STREP B DNA PROBE: GBSP: POSITIVE

## 2012-05-09 ENCOUNTER — Ambulatory Visit (INDEPENDENT_AMBULATORY_CARE_PROVIDER_SITE_OTHER): Payer: 59 | Admitting: Obstetrics and Gynecology

## 2012-05-09 ENCOUNTER — Encounter: Payer: Self-pay | Admitting: Obstetrics and Gynecology

## 2012-05-09 ENCOUNTER — Encounter: Payer: 59 | Admitting: Obstetrics and Gynecology

## 2012-05-09 VITALS — BP 110/70 | Wt 182.0 lb

## 2012-05-09 DIAGNOSIS — Z3490 Encounter for supervision of normal pregnancy, unspecified, unspecified trimester: Secondary | ICD-10-CM

## 2012-05-09 DIAGNOSIS — Z331 Pregnant state, incidental: Secondary | ICD-10-CM

## 2012-05-09 NOTE — Progress Notes (Signed)
GBS pos 05/04/12 Pap 05/04/12 ASCUS Pt states cx 3 cm reqs cx check again today

## 2012-05-09 NOTE — Progress Notes (Signed)
Doing well, some contractions last night. Cervix essentially unchanged. Reviewed GBS report. Pap HPV testing pending--will f/u based on that report.

## 2012-05-10 ENCOUNTER — Telehealth: Payer: Self-pay | Admitting: Obstetrics and Gynecology

## 2012-05-10 ENCOUNTER — Encounter: Payer: Self-pay | Admitting: Obstetrics and Gynecology

## 2012-05-10 LAB — HUMAN PAPILLOMAVIRUS, HIGH RISK: HPV DNA High Risk: NOT DETECTED

## 2012-05-10 NOTE — Progress Notes (Signed)
Quick Note:  Please send ASCUS letter. Pap in 1 year. ______ 

## 2012-05-10 NOTE — Telephone Encounter (Signed)
Pt called complaining of ligament pain. Pt stated that she had taken Tylenol and wanted to know what else to do. Pt was advised to take a warm bath/shower, rest on opposite side and to use pillow for support. Pt also stated that she had very small amount of bloody show after exam on last week. Pt was advised that was normal and if any increase to please contact the office. Mathis Bud

## 2012-05-11 ENCOUNTER — Encounter: Payer: 59 | Admitting: Obstetrics and Gynecology

## 2012-05-16 ENCOUNTER — Encounter (HOSPITAL_COMMUNITY): Payer: Self-pay | Admitting: *Deleted

## 2012-05-16 ENCOUNTER — Telehealth (HOSPITAL_COMMUNITY): Payer: Self-pay | Admitting: *Deleted

## 2012-05-16 NOTE — Telephone Encounter (Signed)
Preadmission screen  

## 2012-05-18 ENCOUNTER — Encounter: Payer: Self-pay | Admitting: Obstetrics and Gynecology

## 2012-05-18 ENCOUNTER — Ambulatory Visit (INDEPENDENT_AMBULATORY_CARE_PROVIDER_SITE_OTHER): Payer: 59 | Admitting: Obstetrics and Gynecology

## 2012-05-18 VITALS — BP 100/68 | Wt 182.0 lb

## 2012-05-18 DIAGNOSIS — Z331 Pregnant state, incidental: Secondary | ICD-10-CM

## 2012-05-18 DIAGNOSIS — Z3490 Encounter for supervision of normal pregnancy, unspecified, unspecified trimester: Secondary | ICD-10-CM

## 2012-05-18 NOTE — Progress Notes (Signed)
HPV not detected 05/04/12  Pt c/o intensing vag pain & pressure unable to close legs Pt reqs cx check

## 2012-05-18 NOTE — Progress Notes (Signed)
Lots of pelvic and pubic pressure. Cervix 3-3, 70%, vtx, -1. Labor s/s discussed.

## 2012-05-24 ENCOUNTER — Encounter (HOSPITAL_COMMUNITY): Payer: Self-pay

## 2012-05-24 ENCOUNTER — Inpatient Hospital Stay (HOSPITAL_COMMUNITY)
Admission: AD | Admit: 2012-05-24 | Discharge: 2012-05-26 | DRG: 776 | Disposition: A | Discharge status: Home / Self Care | Payer: 59 | Source: Ambulatory Visit | Attending: Obstetrics and Gynecology | Admitting: Obstetrics and Gynecology

## 2012-05-24 DIAGNOSIS — O99893 Other specified diseases and conditions complicating puerperium: Secondary | ICD-10-CM | POA: Diagnosis present

## 2012-05-24 DIAGNOSIS — O9081 Anemia of the puerperium: Secondary | ICD-10-CM | POA: Diagnosis present

## 2012-05-24 DIAGNOSIS — Z22330 Carrier of Group B streptococcus: Secondary | ICD-10-CM

## 2012-05-24 DIAGNOSIS — IMO0001 Reserved for inherently not codable concepts without codable children: Secondary | ICD-10-CM

## 2012-05-24 DIAGNOSIS — D649 Anemia, unspecified: Secondary | ICD-10-CM | POA: Diagnosis present

## 2012-05-24 HISTORY — DX: Reserved for inherently not codable concepts without codable children: IMO0001

## 2012-05-24 MED ORDER — IBUPROFEN 600 MG PO TABS
600.0000 mg | ORAL_TABLET | Freq: Four times a day (QID) | ORAL | Status: DC
  Administered 2012-05-24 – 2012-05-26 (×9): 600 mg via ORAL
  Filled 2012-05-24 (×8): qty 1

## 2012-05-24 MED ORDER — DIBUCAINE 1 % RE OINT: 1.0000 "application " | TOPICAL_OINTMENT | RECTAL | Status: DC | PRN

## 2012-05-24 MED ORDER — OXYTOCIN 40 UNITS IN LACTATED RINGERS INFUSION - SIMPLE MED: 62.5000 mL/h | INTRAVENOUS | Status: DC | PRN

## 2012-05-24 MED ORDER — WITCH HAZEL-GLYCERIN EX PADS: 1.0000 "application " | MEDICATED_PAD | CUTANEOUS | Status: DC | PRN

## 2012-05-24 MED ORDER — TETANUS-DIPHTH-ACELL PERTUSSIS 5-2.5-18.5 LF-MCG/0.5 IM SUSP
0.5000 mL | Freq: Once | INTRAMUSCULAR | Status: AC
Start: 2012-05-25 — End: 2012-05-25
  Administered 2012-05-25: 0.5 mL via INTRAMUSCULAR
  Filled 2012-05-24: qty 0.5

## 2012-05-24 MED ORDER — DIPHENHYDRAMINE HCL 25 MG PO CAPS: 25.0000 mg | ORAL_CAPSULE | Freq: Four times a day (QID) | ORAL | Status: DC | PRN

## 2012-05-24 MED ORDER — OXYTOCIN 40 UNITS IN LACTATED RINGERS INFUSION - SIMPLE MED
INTRAVENOUS | Status: AC
  Administered 2012-05-24: 40 [IU]
  Filled 2012-05-24: qty 1000

## 2012-05-24 MED ORDER — BENZOCAINE-MENTHOL 20-0.5 % EX AERO
1.0000 "application " | INHALATION_SPRAY | CUTANEOUS | Status: DC | PRN
  Filled 2012-05-24: qty 56

## 2012-05-24 MED ORDER — MAGNESIUM HYDROXIDE 400 MG/5ML PO SUSP: 30.0000 mL | ORAL | Status: DC | PRN

## 2012-05-24 MED ORDER — PRENATAL MULTIVITAMIN CH
1.0000 | ORAL_TABLET | Freq: Every day | ORAL | Status: DC
  Administered 2012-05-24 – 2012-05-26 (×3): 1 via ORAL
  Filled 2012-05-24 (×5): qty 1

## 2012-05-24 MED ORDER — ONDANSETRON HCL 4 MG/2ML IJ SOLN: 4.0000 mg | INTRAMUSCULAR | Status: DC | PRN

## 2012-05-24 MED ORDER — METHYLERGONOVINE MALEATE 0.2 MG/ML IJ SOLN: 0.2000 mg | INTRAMUSCULAR | Status: DC | PRN

## 2012-05-24 MED ORDER — OXYCODONE-ACETAMINOPHEN 5-325 MG PO TABS
1.0000 | ORAL_TABLET | ORAL | Status: DC | PRN
  Administered 2012-05-24 – 2012-05-26 (×8): 1 via ORAL
  Filled 2012-05-24 (×10): qty 1

## 2012-05-24 MED ORDER — ZOLPIDEM TARTRATE 5 MG PO TABS: 5.0000 mg | ORAL_TABLET | Freq: Every evening | ORAL | Status: DC | PRN

## 2012-05-24 MED ORDER — LANOLIN HYDROUS EX OINT: TOPICAL_OINTMENT | CUTANEOUS | Status: DC | PRN

## 2012-05-24 MED ORDER — SIMETHICONE 80 MG PO CHEW: 80.0000 mg | CHEWABLE_TABLET | ORAL | Status: DC | PRN

## 2012-05-24 MED ORDER — LIDOCAINE HCL (PF) 1 % IJ SOLN
INTRAMUSCULAR | Status: AC
  Administered 2012-05-24: 30 mL
  Filled 2012-05-24: qty 30

## 2012-05-24 MED ORDER — METHYLERGONOVINE MALEATE 0.2 MG PO TABS: 0.2000 mg | ORAL_TABLET | ORAL | Status: DC | PRN

## 2012-05-24 MED ORDER — SENNOSIDES-DOCUSATE SODIUM 8.6-50 MG PO TABS
2.0000 | ORAL_TABLET | Freq: Every day | ORAL | Status: DC
  Administered 2012-05-24 – 2012-05-25 (×2): 2 via ORAL

## 2012-05-24 MED ORDER — ONDANSETRON HCL 4 MG PO TABS: 4.0000 mg | ORAL_TABLET | ORAL | Status: DC | PRN

## 2012-05-24 NOTE — H&P (Signed)
Shannon Mccarty is a 36 y.o.MW female who presents unannounced via EMS s/p precipitous delivery at home at 0242.  Pt reports onset of strong ctxs around 0100; SROM around 0200.  Reports events happened so quickly and trying to arrange childcare for her 2 daughters, that they had to call EMS, and first responder delivered newborn.  Reports newborn breastfed en route to hospital and EMT delivered placenta.  Pt and husband are unsure of blood loss, and did not hear Apgar score.  Pt denies recent illness or fever.  No GI or resp c/o's.  No UTI or PIH s/s.  Prenatal course: Pt entered care at [redacted]w[redacted]d and had viability u/s secondary to h/o SAB in Oct 2012; [facility] on u/s c/w LMP.  Pt had Harmony test done and negative.  Normal anatomy u/s.  Pt's pregnancy was complicated by dynamic cx again this pregnancy, and cervical length and dilatation closely monitored, along w/ mild fetal ventriculomegaly noted on u/s at 30 weeks and pt referred to MFM at 31 weeks.  Borderline Lt pyelectasis noted at same time, but resolved on 35 week f/u u/s per Dr. Sherrie George; mild ventriculomegaly persisted at that time.  Pt was offered 17-p injections in early pregnancy, and did not agree to receive until 28 weeks, after which she received weekly.  Her cx has been 3 cm for a few weeks.  She did have elevated 1hr gtt, but 3hr gtt WNL.  Pap in 12/'12 CIN I; had colpo in 1/'13; repeat pap 05/04/12 ASCUS w/ neg HR HPV-repeat in 1 yr.  OB HX: G1=SVD '06 F=7+2 at 37 weeks; PTL at 33 weeks w/ BR ([place], [place]) G2=SVD '10 F=7=2 at [redacted]w[redacted]d; PROM; PTL as well prior to PROM [facility]) G3=SAB 10/'12; 11 weeks w/ D&E G4=current  Maternal Medical History:  Prenatal complications: 1.  AMA 2.  Mild fetal ventriculomegaly on u/s 3rd trimester-collaboration w/ MFM 3.  H/o PTL and 37 week delivery w/ G1 4.  H/o PTL and PTD w/ G2 5.  H/o thyroid nodule 6.  H/o abnl pap (CIN I) 12/'12; colpo in 1/'13; repeat pap 05/04/12 ASCUS w no HPV detected so repeat pap in 1  yr 7.  Elevated 1hr gtt; normal 3hr gtt 8.  Anemia in pregnancy    OB History    Grav Para Term Preterm Abortions TAB SAB Ect Mult Living   4 2 1 1 1  1   2      Past Medical History  Diagnosis Date  . Thyroid nodule 2009  . Preterm labor   . H/O varicella   . H/O cystitis      x 1  . History of hemorrhoids   . PRB (rectal bleeding)   . History of constipation   . Rectal pain   . Anemia   . AMA (advanced maternal age) multigravida 35+   . Abnormal Pap smear   . NSVD (normal spontaneous vaginal delivery) 05/24/2012  . Rapid First Stage Of Labor 05/24/2012   Past Surgical History  Procedure Date  . Dilation and evacuation 06/30/2011    Procedure: DILATATION AND EVACUATION (D&E);  Surgeon: Purcell Nails, MD;  Location: [facility];  Service: Gynecology;  Laterality: N/A;  . Wisdom tooth extraction 2007   Family History: family history includes Asthma in her daughter; Cancer in her mother; Peripheral vascular disease in her mother; and Thyroid disease in her mother. Social History:  reports that she has never smoked. She has never used smokeless tobacco. She reports that  she does not drink alcohol or use illicit drugs.Pt is a SAHM.  Husband, "Shannon Mccarty" is a P.A.   Prenatal Transfer Tool  Maternal Diabetes: No Genetic Screening: Normal Maternal Ultrasounds/Referrals: Abnormal:  Findings:   Fetal renal pyelectasis, Other: Fetal Ultrasounds or other Referrals:  Referred to Materal Fetal Medicine  Maternal Substance Abuse:  No Significant Maternal Medications:  Meds include: Other:  Significant Maternal Lab Results:  Lab values include: Group B Strep positive Other Comments:  started 17-p at 28 weeks; MFM x2 u/s for mild ventriculomegaly and borderline Lt pyelectasis; Harmony negative; elevated 1hr gtt; normal 3hr.  Review of Systems  Constitutional: Negative.   HENT: Negative.   Eyes: Negative.   Respiratory: Negative.   Cardiovascular: Negative.   Gastrointestinal: Negative.    Genitourinary: Negative.   Skin: Negative.   Neurological: Negative.       Blood pressure 116/68, pulse 68, temperature 99.3 F (37.4 C), temperature source Axillary, resp. rate 18, last menstrual period 08/30/2011. Exam Physical Exam  Constitutional: She is oriented to person, place, and time. She appears well-developed and well-nourished. No distress.  HENT:  Head: Atraumatic.  Eyes: Pupils are equal, round, and reactive to light.       Glasses on  Cardiovascular: Normal rate.   Respiratory: Effort normal and breath sounds normal.  GI: Soft.       Uterus firm, below umbilicus  Genitourinary:       Inspection:  Trailing membranes at introitus; 2nd degree laceration noted  Musculoskeletal: She exhibits no edema.  Neurological: She is alert and oriented to person, place, and time. She has normal reflexes.  Skin: Skin is dry.  Psychiatric: She has a normal mood and affect. Her behavior is normal. Judgment and thought content normal.    Prenatal labs: ABO, Rh: A/Positive/-- (03/14 0000) Antibody: Negative (03/14 0000) Rubella: Immune (03/14 0000) RPR: NON REAC (06/14 1603)  HBsAg: Negative (03/14 0000)  HIV: Non-reactive, Non-reactive, Non-reactive (03/14 0000)  GBS: POSITIVE (08/14 1034)  1hr gtt=151 3hr gtt=WNL Harmony:  Negative  Assessment/Plan: 1.  S/p precipitous delivery at home at [redacted]w[redacted]d; delivery by first responder 2.  GBS pos with no prophylaxis 3.  Desires inpatient circ 4.  Breastfeeding 5.  Newborn w/ h/o mild ventriculomegaly on u/s during pregnancy  1.  Admit to [facility] with Dr. Normand Sloop as attending 2.  Routine PP orders 3.  Support as needed   Shannon Mccarty H 05/24/2012, 5:29 AM

## 2012-05-24 NOTE — Progress Notes (Signed)
Post Partum Day 0,  s/p NSVD at home 02.45hrs Subjective: no complaints, up ad lib without syncope, voiding, tolerating PO, + flatus  Pain well controlled with po meds BF baby is latching well and coping well Noted some facial suffusion on the neonate due to speed of delivery. Patient advised that it would subside Mood stable, bonding well Contraception: to be discussed   Objective: Blood pressure 107/55, pulse 70, temperature 98 F (36.7 C), temperature source Oral, resp. rate 18, last menstrual period 08/30/2011.  Physical Exam:  General: alert, cooperative and no distress Lungs: CTAB Heart: RRR Breasts: non-tender Lochia: appropriate Uterine Fundus: firm Perineum:2nd degree laceration, healing well. DVT Evaluation: No evidence of DVT seen on physical exam. Negative Homan's sign.  No results found for this basename: HGB:2,HCT:2 in the last 72 hours  Assessment/Plan: Will d/c home on Thursday.      LOS: 0 days   Khalee Mazo, CNM. 05/24/2012, 11:02 AM

## 2012-05-25 LAB — CBC
Hemoglobin: 8.9 g/dL — ABNORMAL LOW (ref 12.0–15.0)
MCH: 27 pg (ref 26.0–34.0)
MCHC: 32 g/dL (ref 30.0–36.0)
RDW: 14.3 % (ref 11.5–15.5)

## 2012-05-25 LAB — RPR: RPR Ser Ql: NONREACTIVE

## 2012-05-25 MED ORDER — DOCUSATE SODIUM 100 MG PO CAPS
100.0000 mg | ORAL_CAPSULE | Freq: Two times a day (BID) | ORAL | Status: DC
  Administered 2012-05-25 – 2012-05-26 (×2): 100 mg via ORAL
  Filled 2012-05-25 (×2): qty 1

## 2012-05-25 MED ORDER — FERROUS SULFATE 325 (65 FE) MG PO TABS
325.0000 mg | ORAL_TABLET | Freq: Two times a day (BID) | ORAL | Status: DC
  Administered 2012-05-25 – 2012-05-26 (×2): 325 mg via ORAL
  Filled 2012-05-25 (×2): qty 1

## 2012-05-25 NOTE — Progress Notes (Signed)
Post Partum Day D1 NSVD - Baby born at home with EMS and her husband a PA Subjective: no complaints, up ad lib without syncope, voiding, tolerating PO, + flatus  Pain well controlled with po meds BF: baby is feeding well with no problems Mood stable, bonding well Contraception:    Objective: Blood pressure 122/75, pulse 76, temperature 97.9 F (36.6 C), temperature source Oral, resp. rate 18, last menstrual period 08/30/2011.  Physical Exam:  General: alert and cooperative Lungs: CTAB Heart: RRR Breasts: nontender Lochia: appropriate Uterine Fundus: firm Perineum: 2nd degree laceration, healing well. DVT Evaluation: No evidence of DVT seen on physical exam. Negative Homan's sign.  Postpartum anemia - to commence on iron supplements and stool softer. Birth control: would like to start Micronor on weeks 3-4 pp and then possibly Mirena as discussed.   Basename 05/25/12 0510  HGB 8.9*  HCT 27.8*   Orthostatic vital signs normal Assessment/Plan: Plan for discharge tomorrow      LOS: 1 day   Shannon Mccarty, CNM 05/25/2012, 4:20 PM

## 2012-05-26 ENCOUNTER — Encounter (HOSPITAL_COMMUNITY): Payer: Self-pay | Admitting: *Deleted

## 2012-05-26 MED ORDER — FERRALET 90 90-1 MG PO TABS: 1.0000 | ORAL_TABLET | Freq: Two times a day (BID) | ORAL | Status: DC

## 2012-05-26 MED ORDER — NORETHINDRONE 0.35 MG PO TABS: 1.0000 | ORAL_TABLET | Freq: Every day | ORAL | Status: DC

## 2012-05-26 MED ORDER — IBUPROFEN 600 MG PO TABS: 600.0000 mg | ORAL_TABLET | Freq: Four times a day (QID) | ORAL | Status: AC

## 2012-05-27 ENCOUNTER — Telehealth: Payer: Self-pay | Admitting: Obstetrics and Gynecology

## 2012-05-27 NOTE — Telephone Encounter (Signed)
Triage/rx req. °

## 2012-05-27 NOTE — Telephone Encounter (Signed)
Lm on vm tcb rgd msg 

## 2012-06-04 NOTE — Discharge Summary (Signed)
Obstetric Discharge Summary Reason for Admission: SVD of infant at home attended by EMS Prenatal Procedures: ultrasound Intrapartum Procedures: none, pt had delivered at home Postpartum Procedures: none Complications-Operative and Postpartum: 2nd degree perineal laceration Hemoglobin  Date Value Range Status  05/25/2012 8.9* 12.0 - 15.0 g/dL Final  1/61/0960 45.4   Final  12/03/2011 11.7   Final     HCT  Date Value Range Status  05/25/2012 27.8* 36.0 - 46.0 % Final  12/03/2011 38   Final  12/03/2011 38   Final    Hospital course: Pt arrived after rapid onset of labor and SVD of female infant at home attended by EMS. Upon arrival a 2nd degree laceration was repaired, by C.Steelman, CNM. Pt was GBS positive but did not receive prophylaxis. Postpartum course was uneventful, w BF going well, She had mild anemia but otherwise infant and mom stable and ready for d/c on PPD 2. She was undecided on contraception but given a RX for micronor to start in 3wks, was also considering mirena.     Physical Exam:  General: alert and no distress Lochia: appropriate Uterine Fundus: firm Incision: healing well DVT Evaluation: No evidence of DVT seen on physical exam. Negative Homan's sign. No significant calf/ankle edema.  Discharge Diagnoses: Term Pregnancy-delivered  Discharge Information: Date: 06/04/2012 Activity: pelvic rest Diet: routine and iron-rich foods Medications: PNV, Ibuprofen and Iron, micronor  Condition: stable Instructions: refer to practice specific booklet Discharge to: home Follow-up Information    Follow up with [facility] in 5 weeks.         Newborn Data: Live born female  Birth Weight: 8 lb 8 oz (3856 g) APGAR: , unknown, secondary to delivery at home   Home with mother.  Samson Ralph M 06/04/2012, 12:33 PM

## 2012-06-09 ENCOUNTER — Telehealth: Payer: Self-pay

## 2012-06-09 NOTE — Telephone Encounter (Signed)
PC from pt requesting email address.  Pt notified that email is [email or web address].  ld

## 2012-07-05 ENCOUNTER — Encounter: Payer: Self-pay | Admitting: Obstetrics and Gynecology

## 2012-07-05 ENCOUNTER — Ambulatory Visit (INDEPENDENT_AMBULATORY_CARE_PROVIDER_SITE_OTHER): Payer: 59 | Admitting: Obstetrics and Gynecology

## 2012-07-05 DIAGNOSIS — Z23 Encounter for immunization: Secondary | ICD-10-CM

## 2012-07-05 DIAGNOSIS — Z8719 Personal history of other diseases of the digestive system: Secondary | ICD-10-CM

## 2012-07-05 MED ORDER — HYDROCORTISONE ACE-PRAMOXINE 1-1 % RE FOAM: 1.0000 | Freq: Two times a day (BID) | RECTAL | Status: DC

## 2012-07-05 NOTE — Progress Notes (Signed)
Patient ID: [name], female   DOB: [birth year], 36 y.o.   MRN: [id number] Date of delivery: 06/13/2012.Marland KitchenMarland KitchenDelivered at home by husband and [employer]! Female Name: Shannon Mccarty Vaginal delivery:yes Cesarean section:no Tubal ligation:no GDM:no Breast Feeding:no Bottle Feeding:no Post-Partum Blues:yes Abnormal pap:yes. Last pap 04/2012 ASCUS neg HPV. Normal GU function: yes Normal GI function:yes Returning to work:no. Stay at home Mom. / Flu vaccine given today. Alvino Chapel

## 2012-07-05 NOTE — Progress Notes (Signed)
Shannon Mccarty is a 36 y.o. female who presents for a postpartum visit.   Type of delivery:  Precipitous delivery at home, attended by husband and [employer]  Patient reports doing well--some baby blues for 2-3 weeks, but improved.  Has hemorrhoids and possible anal fissures (had in past, saw Dr. Anselmo Rod for treatment).  Hx remarkable for: Patient Active Problem List  Diagnosis  . AMA (advanced maternal age) multigravida 35+  . Hx of preterm delivery, currently pregnant  . Thyroid nodule  . CIN I (cervical intraepithelial neoplasia I)  . Cervical shortening  . Cerebral ventriculomegaly of fetus - mild bilateral  . Left Pyelectasis - mild   . NSVD (normal spontaneous vaginal delivery)  . Precipitous delivery  . Rapid First Stage Of Labor  . Second degree perineal laceration during delivery      PPDS = 4  Contraception plan:  Wants Mirena   I have fully reviewed the prenatal and intrapartum course   Patient has not been sexually active since delivery.   The following portions of the patient's history were reviewed and updated as appropriate: allergies, current medications, past family history, past medical history, past social history, past surgical history and problem list.  Review of Systems Pertinent items are noted in HPI.   Objective:    BP 104/62  Resp 16  Ht 5\' 3"  (1.6 m)  Wt 160 lb (72.576 kg)  BMI 28.34 kg/m2  General:  alert, cooperative and no distress     Lungs: clear to auscultation bilaterally  Heart:  regular rate and rhythm, S1, S2 normal, no murmur  Abdomen: soft, non-tender; bowel sounds normal; no masses,  no organomegaly.   Incision:  NA   Vulva:  normal  Vagina: normal vagina, well-healed 2nd degree laceration  Cervix:  normal  Uterus: normal size, contour, position, consistency, mobility, non-tender, well-involuted  Adnexa:  normal adnexa  Rectum   No obvious external hemorrhoids          Assessment:     Normal postpartum  exam.  Pap smear not done at today's visit.   Due in 1-2 years. Possible internal hemorrhoids and/or anal fissures Wants Mirena--declines cultures.  Plan:  Follow-up in 1 week for Mirena--cultures declined. Rxs:  Proctofoam HC--to trial for hemorrhoid/? Anal fissure discomfort. If no benefit in 1-2 weeks, will refer back to GI MD (Dr. Elsie Amis) for further evaluation and treatment of fissures.  Nigel Bridgeman CNM, [place] 07/05/2012 11:34 AM

## 2012-07-20 ENCOUNTER — Encounter: Payer: 59 | Admitting: Obstetrics and Gynecology

## 2012-07-26 ENCOUNTER — Encounter: Payer: Self-pay | Admitting: Obstetrics and Gynecology

## 2012-07-26 ENCOUNTER — Ambulatory Visit (INDEPENDENT_AMBULATORY_CARE_PROVIDER_SITE_OTHER): Payer: 59 | Admitting: Obstetrics and Gynecology

## 2012-07-26 VITALS — BP 100/68 | HR 72 | Wt 161.0 lb

## 2012-07-26 DIAGNOSIS — IMO0001 Reserved for inherently not codable concepts without codable children: Secondary | ICD-10-CM

## 2012-07-26 DIAGNOSIS — Z30430 Encounter for insertion of intrauterine contraceptive device: Secondary | ICD-10-CM

## 2012-07-26 DIAGNOSIS — Z309 Encounter for contraceptive management, unspecified: Secondary | ICD-10-CM

## 2012-07-26 LAB — POCT URINE PREGNANCY: Preg Test, Ur: NEGATIVE

## 2012-07-26 MED ORDER — LEVONORGESTREL 20 MCG/24HR IU IUD
INTRAUTERINE_SYSTEM | Freq: Once | INTRAUTERINE | Status: AC
  Administered 2012-07-26: 1 via INTRAUTERINE

## 2012-07-26 NOTE — Patient Instructions (Signed)
Call [facility] [phone]:  -for temperature of 100.4 degrees Fahrenheit or more -pain not improved with over the counter pain medications (Ibuprofen, Advil, Aleve,        Tylenol or acetaminophen) -for excessive bleeding (more than a usual period) -for any other concerns  Do not place anything in your vagina for the next 7 days    

## 2012-07-26 NOTE — Progress Notes (Signed)
IUD INSERTION NOTE  Shannon Mccarty is a 36 y.o. female [phone] who presents for IUD insertion.  Consent signed after risks and benefits were reviewed including but not limited to bleeding, infection, expulsion and risk of uterine perforation that may require an additional procedure for removal.  LMP: No LMP recorded. Patient is not currently having periods (Reason: Lactating). UPT: negative  Uterus assessed for size and position Prepped with Betadine Tenaculum placed on anterior lip of cervix after Hurricane gel was applied Uterus sounded at  8 cm Insertion of MIRENA IUD per protocol without any complications Strings trimmed   Assessment:  IUD Insertion  Plan:  1. Patient instructed to call with oral temperature of 100.4 degrees Fahrenheit or more, excessive bleeding or pain that is not relieved with OTC analgesia taken as directed  2. Patient instructed on how  to check IUD strings and encouraged to do so after each menstrual cycle  3. Advised not to place anything in vagina or have sexual intercourse for 7 days  4. Follow-up: 4  weeks   Nikala Walsworth PA-C 07/26/2012 10:48 AM

## 2012-08-23 ENCOUNTER — Encounter: Payer: Self-pay | Admitting: Obstetrics and Gynecology

## 2012-08-23 ENCOUNTER — Ambulatory Visit (INDEPENDENT_AMBULATORY_CARE_PROVIDER_SITE_OTHER): Payer: 59 | Admitting: Obstetrics and Gynecology

## 2012-08-23 VITALS — BP 100/70 | Wt 145.0 lb

## 2012-08-23 DIAGNOSIS — Z30431 Encounter for routine checking of intrauterine contraceptive device: Secondary | ICD-10-CM

## 2012-08-23 NOTE — Progress Notes (Signed)
36 YO with recent Mirena insertion reports a panty liner daily.  Has occasional cramping but no urinary tract, bowel or vaginitis symptoms.  O: Pelvic: EGBUS-wnl, vagina-brown discharge, cervix-strings visible, uterus/adnexae-normal without tenderness or masses  A: IUD Check     Breastfeeding  P: Plans to resume the [employer] Diet       RTO-as scheduled or prn  Cheyenne Bordeaux, PA-C

## 2012-08-23 NOTE — Addendum Note (Signed)
Addended byWinfred Leeds on: 08/23/2012 11:08 AM   Modules accepted: Orders

## 2012-08-26 ENCOUNTER — Ambulatory Visit: Payer: 59 | Admitting: Obstetrics and Gynecology

## 2012-11-09 ENCOUNTER — Telehealth: Payer: Self-pay | Admitting: Obstetrics and Gynecology

## 2012-11-09 NOTE — Telephone Encounter (Signed)
Spoke with pt rgd msg pt wants to know what she can take for a cold while breast feeding advised pt can take plain robitussin,plaindelsyum,plain mucinex,plain zyrtec pt voice understanding

## 2012-11-24 ENCOUNTER — Other Ambulatory Visit: Payer: Self-pay | Admitting: Endocrinology

## 2013-03-16 ENCOUNTER — Ambulatory Visit
Admission: RE | Admit: 2013-03-16 | Discharge: 2013-03-16 | Disposition: A | Discharge status: Home / Self Care | Payer: 59 | Source: Ambulatory Visit | Attending: Endocrinology | Admitting: Endocrinology

## 2013-03-16 DIAGNOSIS — E049 Nontoxic goiter, unspecified: Secondary | ICD-10-CM

## 2013-05-29 ENCOUNTER — Other Ambulatory Visit: Payer: 59

## 2013-09-08 ENCOUNTER — Other Ambulatory Visit: Payer: Self-pay | Admitting: Endocrinology

## 2013-09-08 DIAGNOSIS — E040 Nontoxic diffuse goiter: Secondary | ICD-10-CM

## 2013-09-25 ENCOUNTER — Other Ambulatory Visit: Payer: 59

## 2014-07-23 ENCOUNTER — Encounter: Payer: Self-pay | Admitting: Obstetrics and Gynecology

## 2014-09-03 ENCOUNTER — Ambulatory Visit
Admission: RE | Admit: 2014-09-03 | Discharge: 2014-09-03 | Disposition: A | Discharge status: Home / Self Care | Payer: 59 | Source: Ambulatory Visit | Attending: Endocrinology | Admitting: Endocrinology

## 2014-09-03 DIAGNOSIS — E040 Nontoxic diffuse goiter: Secondary | ICD-10-CM

## 2016-01-21 ENCOUNTER — Other Ambulatory Visit: Payer: Self-pay | Admitting: Endocrinology

## 2016-01-21 DIAGNOSIS — E049 Nontoxic goiter, unspecified: Secondary | ICD-10-CM

## 2016-01-23 ENCOUNTER — Ambulatory Visit
Admission: RE | Admit: 2016-01-23 | Discharge: 2016-01-23 | Disposition: A | Discharge status: Home / Self Care | Payer: 59 | Source: Ambulatory Visit | Attending: Endocrinology | Admitting: Endocrinology

## 2016-01-23 DIAGNOSIS — E049 Nontoxic goiter, unspecified: Secondary | ICD-10-CM

## 2016-10-14 ENCOUNTER — Telehealth: Payer: Self-pay | Admitting: Physician Assistant

## 2016-10-14 NOTE — Telephone Encounter (Signed)
Received records from Dr Dois DavenportSandra Rivard for appointment with Azalee CourseHao Meng PA for 10/30/16.  Records put with Hao's schedule for 10/30/16. lp

## 2016-10-16 ENCOUNTER — Ambulatory Visit: Payer: 59 | Admitting: Physician Assistant

## 2016-10-30 ENCOUNTER — Ambulatory Visit: Payer: 59 | Admitting: Physician Assistant

## 2018-01-11 ENCOUNTER — Other Ambulatory Visit: Payer: Self-pay | Admitting: Endocrinology

## 2018-01-11 DIAGNOSIS — E049 Nontoxic goiter, unspecified: Secondary | ICD-10-CM

## 2018-01-25 ENCOUNTER — Ambulatory Visit
Admission: RE | Admit: 2018-01-25 | Discharge: 2018-01-25 | Disposition: A | Discharge status: Home / Self Care | Payer: No Typology Code available for payment source | Source: Ambulatory Visit | Attending: Endocrinology | Admitting: Endocrinology

## 2018-01-25 DIAGNOSIS — E049 Nontoxic goiter, unspecified: Secondary | ICD-10-CM

## 2019-04-24 ENCOUNTER — Other Ambulatory Visit: Payer: Self-pay

## 2019-04-24 ENCOUNTER — Emergency Department
Admission: EM | Admit: 2019-04-24 | Discharge: 2019-04-24 | Disposition: A | Discharge status: Home / Self Care | Payer: Self-pay | Attending: Emergency Medicine | Admitting: Emergency Medicine

## 2019-04-24 DIAGNOSIS — M795 Residual foreign body in soft tissue: Secondary | ICD-10-CM

## 2019-04-24 DIAGNOSIS — I10 Essential (primary) hypertension: Secondary | ICD-10-CM | POA: Insufficient documentation

## 2019-04-24 DIAGNOSIS — K2970 Gastritis, unspecified, without bleeding: Secondary | ICD-10-CM

## 2019-04-24 DIAGNOSIS — R40241 Glasgow coma scale score 13-15, unspecified time: Secondary | ICD-10-CM | POA: Insufficient documentation

## 2019-04-24 DIAGNOSIS — S0561XA Penetrating wound without foreign body of right eyeball, initial encounter: Secondary | ICD-10-CM | POA: Insufficient documentation

## 2019-04-24 DIAGNOSIS — H109 Unspecified conjunctivitis: Secondary | ICD-10-CM | POA: Insufficient documentation

## 2019-04-24 DIAGNOSIS — Y29XXXA Contact with blunt object, undetermined intent, initial encounter: Secondary | ICD-10-CM | POA: Insufficient documentation

## 2019-04-24 DIAGNOSIS — H5710 Ocular pain, unspecified eye: Secondary | ICD-10-CM

## 2019-04-24 MED ORDER — FLUORESCEIN SODIUM 1 MG OP STRP
1.0000 | ORAL_STRIP | Freq: Once | OPHTHALMIC | Status: AC
Start: 2019-04-24 — End: 2019-04-24
  Administered 2019-04-24 (×2): 1 mg via OPHTHALMIC
  Filled 2019-04-24: qty 1

## 2019-04-24 MED ORDER — OFLOXACIN 0.3 % OP SOLN
1.0000 [drp] | Freq: Four times a day (QID) | OPHTHALMIC | 0 refills | Status: AC
Start: 2019-04-24 — End: ?
  Filled 2019-04-24: qty 5, 9d supply, fill #0

## 2019-04-24 MED ORDER — FAMOTIDINE 20 MG OR TABS
20.0000 mg | ORAL_TABLET | Freq: Two times a day (BID) | ORAL | 0 refills | Status: AC
Start: 2019-04-24 — End: ?
  Filled 2019-04-24: qty 60, 30d supply, fill #0

## 2019-04-24 MED ORDER — ERYTHROMYCIN 5 MG/GM OP OINT
1.0000 | TOPICAL_OINTMENT | Freq: Every evening | OPHTHALMIC | 0 refills | Status: AC
Start: 2019-04-24 — End: ?
  Filled 2019-04-24: qty 3.5, 14d supply, fill #0

## 2019-04-24 MED ORDER — PROPARACAINE HCL 0.5 % OP SOLN
1.0000 [drp] | Freq: Once | OPHTHALMIC | Status: AC
Start: 2019-04-24 — End: 2019-04-24
  Administered 2019-04-24 (×2): 1 [drp] via OPHTHALMIC
  Filled 2019-04-24: qty 300

## 2019-04-24 NOTE — ED Notes (Signed)
Patient is medically cleared for discharge and is agreeable to plan. Follow up with primary care and return to ED for worsening symptoms. Follow ED provider instructions. All questions answered. Patient signed paperwork. Pt to [facility] pharmacy to pick up meds

## 2019-04-24 NOTE — ED Provider Notes (Signed)
Emergency Department Note   electronic medical record reviewed for pertinent medical history.       Nursing Triage Note :   Chief Complaint   Patient presents with   . Eye Injury     pt ambulatory into triage, CC: R eye pain, swelling, drainage (yellow/white pus) s/p palm frond poking eye on 7/31 PM. symptoms became worse this AM.       HPI :   Sunday Cornammy Raisch is a 43 year old female who is here for Eye Injury (pt ambulatory into triage, CC: R eye pain, swelling, drainage (yellow/white pus) s/p palm frond poking eye on 7/31 PM. symptoms became worse this AM.) The patient states it is associated with pain, swelling, and drainage  but denies anyassociated blurred vision, runny nose, nasal congestion, severe eye pain, headache, fever, or any other symptom. The patient tried (with little apparent success) nothing nothing for the symptoms. With regards to complicating factors, the patient admits to the palm frond injury but denies trauma, contact lens wear, activity that might predispose to high-velocity projectiles (e.g. metal work), or other. Apart from the above, ROS is otherwise unremarkable with regards to chief complaint.    Recent eye surgery or trauma: minor  FB sensation: yes  Contact lenses: No  Chemical exposure: No  H/o autoimmune dx: No  Prior HSV episodes: No    Denies F/ N/ V/ D/ diaphoresis/ double vision/ pain with eye movement/ proptosis/ HA/ neck pain or stiffness/ photophobia/ numbness/ tingling/ weakness/ gait or balance issues/ sinus pain/ sinus pressure.    Past Medical History : No past medical history on file.    Past Surgical history : No past surgical history on file.    Family History: No family history on file.    Social History:  Social History     Tobacco Use   . Smoking status: Not on file   Substance Use Topics   . Alcohol use: Not on file   . Drug use: Not on file     Alcohol: Denies  Tobacco: Denies  Illicit drugs: Denies  Housed     Medications:   None       Allergies: Patient has no  known allergies.    Review of Systems:  Complete review of 12 systems reviewed and negative unless otherwise noted in the HPI or above. This was done per my custom and practice for systems appropriate to the chief complaint in an emergency department setting and varies depending on the quality of history that the patient is able to provide. History reviewed today as available from records and EPIC chart.      Physical Exam:   04/24/19  1203   BP: 130/66   Pulse: 74   Resp: 16   Temp: 99 F (37.2 C)   SpO2: 100%     Labs Reviewed - No data to display    Medications   proparacaine (ALCAINE) 0.5 % ophthalmic solution 1 drop (has no administration in time range)   fluorescein ophthalmic strip 1 mg (has no administration in time range)     Nursing note and vitals reviewed. Pt not hypoxic.  Gen: Patient is in NAD, non-toxic appearing, cooperative, GCS 15, A/Ox4  HEENT: NC/AT, MMM, oropharynx clear, no LAD, no signs of basilar skull fractures (neg battle sing, neg raccoon eyes)  Neck: Supple. No midline tenderness. No meningeal signs.  Cardiovascular: RRR no m/r/g, normal heart sounds, no JVD  Pulmonary/Chest: CTAB, no increased WOB, no respiratory distress, no  wheezes/rhonchi/rales  Abdominal: Soft. NT/ND, +BS, no r/g, no masses   Extr/MSK: Well perfused, distal pulses intact. No edema. No tenderness. FROM.  Back: No CVAT   Neuro: Awake, alert, and oriented. CN II-XII normal. Speech normal. MAE. Strength 5/5 all 4 ext, SILT, neg pronator drift, no finger to nose ataxia. Ambulates with steady gait.   Psychiatric: Normal affect. Mood not labile nor depressed.   Skin: No rashes, lesions, or wounds.   Eyes:   OS   Sclera anicteric, no conjunctival injection, no Chemosis   PERRL, EOMI without pain or report of diplopia   Visual fields intact on confrontational exam   Slit lamp shows no cells, flare, hyphema, hypopyon   Fluoroscein exam shows no corneal abrasions, ulcer or leakage     OD   Sclera anicteric, no conjunctival  injection, no Chemosis   PERRL, EOMI without pain or report of diplopia   Visual fields intact on confrontational exam   Slit lamp shows no cells, flare, hyphema, hypopyon   Fluoroscein exam shows + small linear abrasion with mild fluorescein uptake in the lower lid conjunctiva. No uptake on eye.   Left Eye (OS) - Uncorrected: 20/20 (c glasses) Both (OU) - Uncorrected: 20/20 (c glasses)   Right (OD) - Pin hole/Corrected: 20/15 (c glasses) Left Eye (OS) - Pin hole/Corrected: 20/15 (c glasses)    Delight Stare       ED Course & Clinical Decision Making:  43 year old  female with PMHx as listed in HPI presents with eye pain after contact with palm tree frond. Trauma exam reassuring. No exchange of body fluids. Patient well appearing with normal vitals with exception of mild hypertension.    Very low clinical suspicion for life threatening or emergent vision impairment causes of eye pathology including acute angle glaucoma, retro orbital swelling, orbital or periorbital cellulitis, orbital skull fracture, or open globe injury.     Patient is not a contact lens wearer. Visual acuity otherwise preserved. Given exam and history, no overt evidence of scleritis, purulent conversion, or corneal ulceration. Patient does however have small suspected soft tissue foreign body which will be treated with antibiotic eyedrops. Patient to avoid wearing contacts in interim and prompt follow up with ophthalmology discussed. TDap updated at [facility] prior to arrival.    ED Reassessment and Disposition:   At this time we feel the patient can safely be discharged in stable condition to previous living situation with strict return precautions per standard of care. The patient is in agreement and understanding of these precautions and will be discharged.    This note was created using voice recognition technology and may contain errors due to environmental circumstances.  The errors include but are not limited to grammatical  errors, punctuation errors, spelling errors, etc.     I have discussed my thought process and plan for the patient with the attending physician, Dr. Toney Rakes.    Geraldo Pitter, [facility]  Emergency Medicine Resident PGY-3         Benjamine Mola, Byrd Hesselbach, MD  Resident  04/24/19 1247       Deliah Goody, MD  04/24/19 [phone]

## 2019-08-24 DIAGNOSIS — E669 Obesity, unspecified: Secondary | ICD-10-CM | POA: Insufficient documentation

## 2019-12-20 ENCOUNTER — Encounter (INDEPENDENT_AMBULATORY_CARE_PROVIDER_SITE_OTHER): Payer: Self-pay

## 2020-01-18 ENCOUNTER — Ambulatory Visit: Payer: No Typology Code available for payment source

## 2020-01-18 ENCOUNTER — Ambulatory Visit: Payer: No Typology Code available for payment source | Attending: Internal Medicine

## 2020-01-18 DIAGNOSIS — Z23 Encounter for immunization: Secondary | ICD-10-CM

## 2020-01-18 NOTE — Progress Notes (Signed)
   Covid-19 Vaccination Clinic  Name:  Shannon Mccarty    MRN: [id number] DOB: 18-Feb-1976  01/18/2020  [name] was observed post Covid-19 immunization for 15 minutes without incident. She was provided with Vaccine Information Sheet and instruction to access the V-Safe system.   [name] was instructed to call 911 with any severe reactions post vaccine: Marland Kitchen Difficulty breathing  . Swelling of face and throat  . A fast heartbeat  . A bad rash all over body  . Dizziness and weakness   Immunizations Administered    Name Date Dose VIS Date Route   Pfizer COVID-19 Vaccine 01/18/2020  9:04 AM 0.3 mL 11/15/2018 Intramuscular   Manufacturer: [employer], [employer]   Lot: [id number]   NDC: [address]-9

## 2020-02-06 ENCOUNTER — Other Ambulatory Visit: Payer: Self-pay | Admitting: Endocrinology

## 2020-02-06 DIAGNOSIS — E049 Nontoxic goiter, unspecified: Secondary | ICD-10-CM

## 2020-02-12 ENCOUNTER — Ambulatory Visit: Payer: No Typology Code available for payment source | Attending: Internal Medicine

## 2020-02-12 DIAGNOSIS — Z23 Encounter for immunization: Secondary | ICD-10-CM

## 2020-02-12 NOTE — Progress Notes (Signed)
   Covid-19 Vaccination Clinic  Name:  Shannon Mccarty    MRN: [id number] DOB: 02/22/1976  02/12/2020  [name] was observed post Covid-19 immunization for 15 minutes without incident. She was provided with Vaccine Information Sheet and instruction to access the V-Safe system.   [name] was instructed to call 911 with any severe reactions post vaccine: Marland Kitchen Difficulty breathing  . Swelling of face and throat  . A fast heartbeat  . A bad rash all over body  . Dizziness and weakness   Immunizations Administered    Name Date Dose VIS Date Route   Pfizer COVID-19 Vaccine 02/12/2020  9:22 AM 0.3 mL 11/15/2018 Intramuscular   Manufacturer: [employer], [employer]   Lot: [id number]   NDC: [address]-5

## 2020-02-21 ENCOUNTER — Ambulatory Visit
Admission: RE | Admit: 2020-02-21 | Discharge: 2020-02-21 | Disposition: A | Discharge status: Home / Self Care | Payer: 59 | Source: Ambulatory Visit | Attending: Endocrinology | Admitting: Endocrinology

## 2020-02-21 DIAGNOSIS — E049 Nontoxic goiter, unspecified: Secondary | ICD-10-CM

## 2020-09-06 DIAGNOSIS — E049 Nontoxic goiter, unspecified: Secondary | ICD-10-CM | POA: Insufficient documentation

## 2021-03-08 IMAGING — US US THYROID
1 series · 14 of 25 positions shown · non-contrast
Comparison: Prior thyroid ultrasound 01/25/2018 and 09/03/2014

CLINICAL DATA: Goiter.

EXAM:
THYROID ULTRASOUND
TECHNIQUE: Ultrasound examination of the thyroid gland and adjacent soft
tissues was performed.

[Series 1: us thyroid · 0.07mm/px · 14 of 44 slices shown]
[im 1/44]
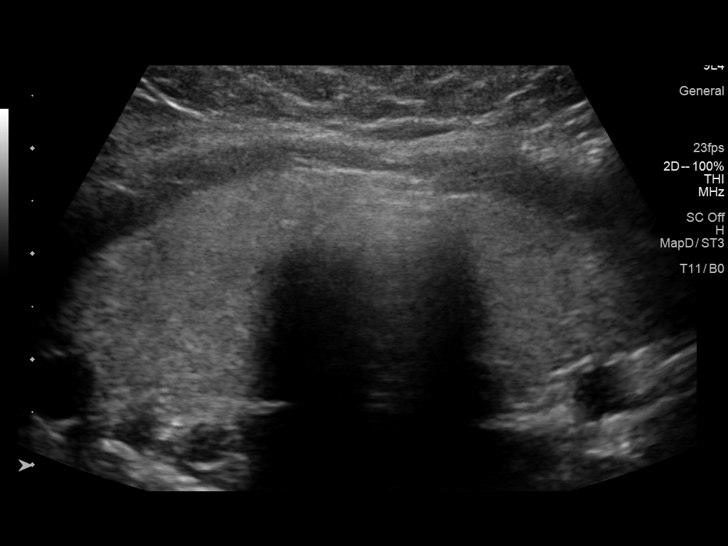
[im 4/44]
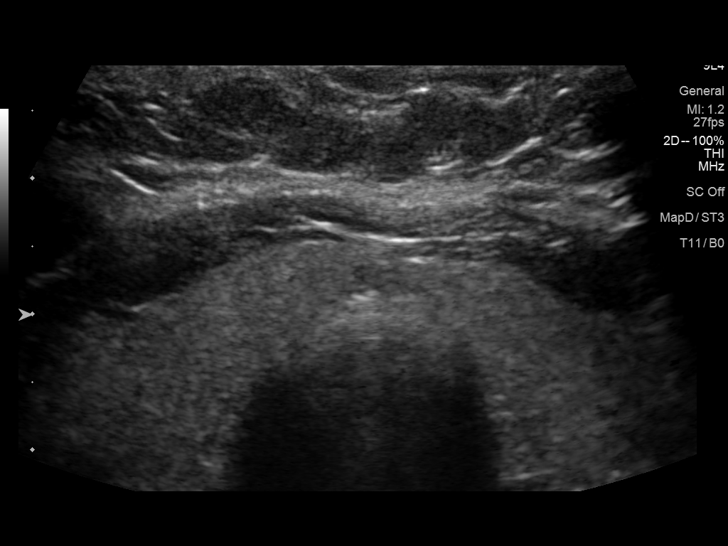
[im 8/44]
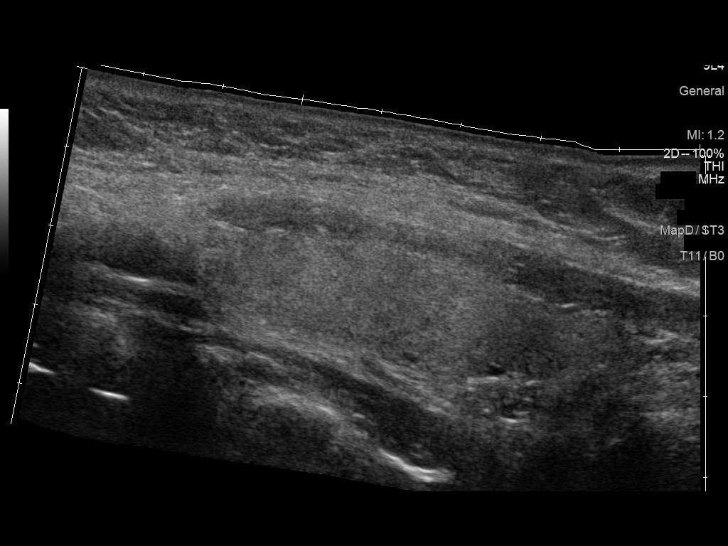
[im 11/44]
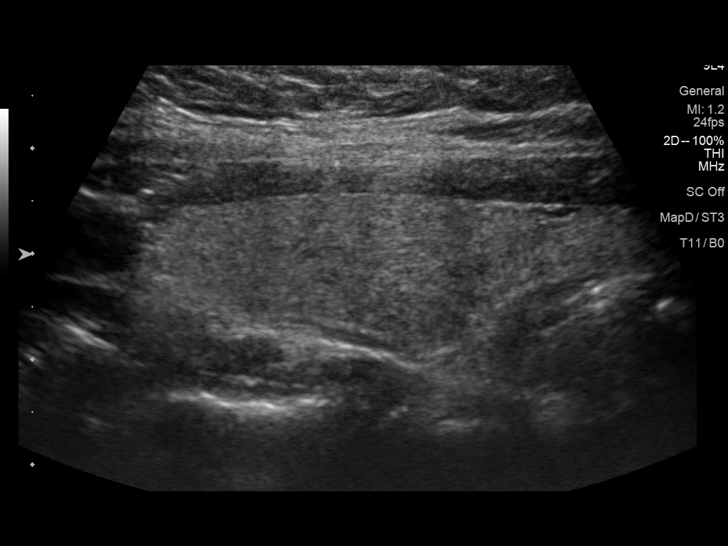
[im 15/44]
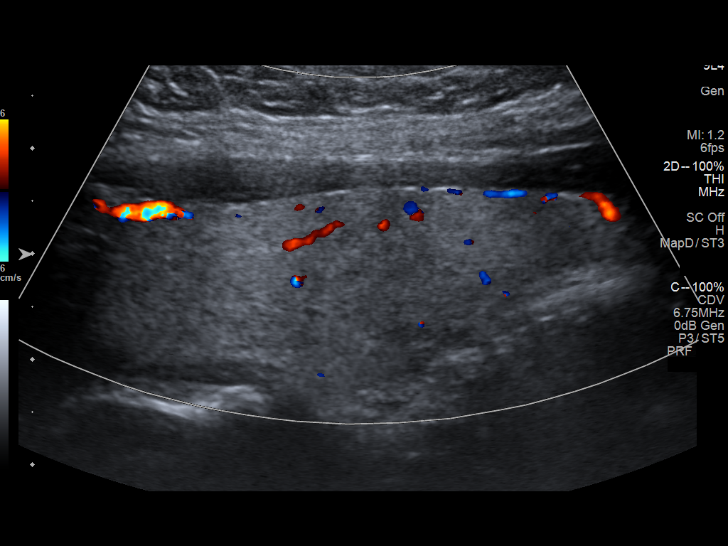
[im 17/44]
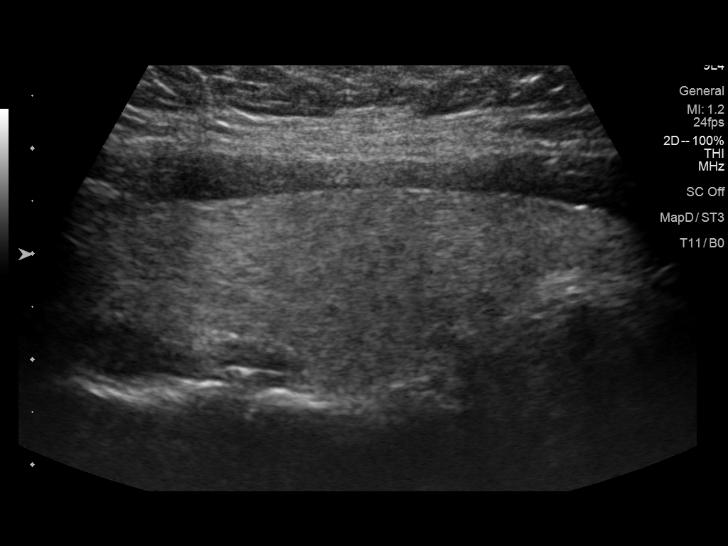
[im 20/44]
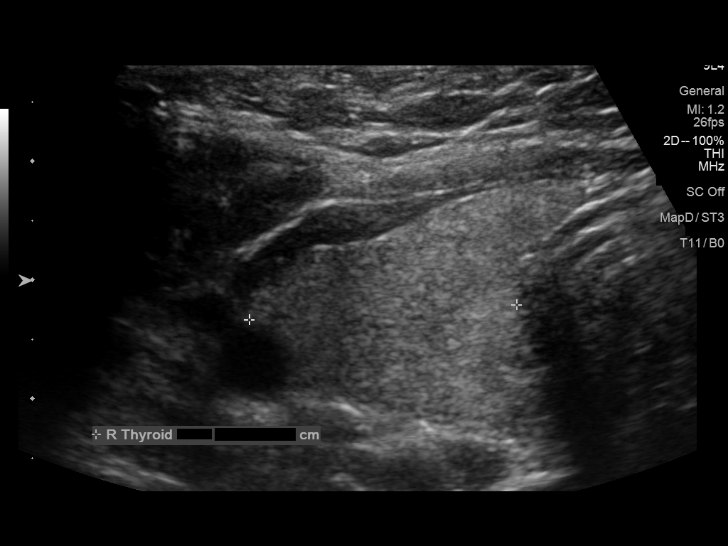
[im 24/44]
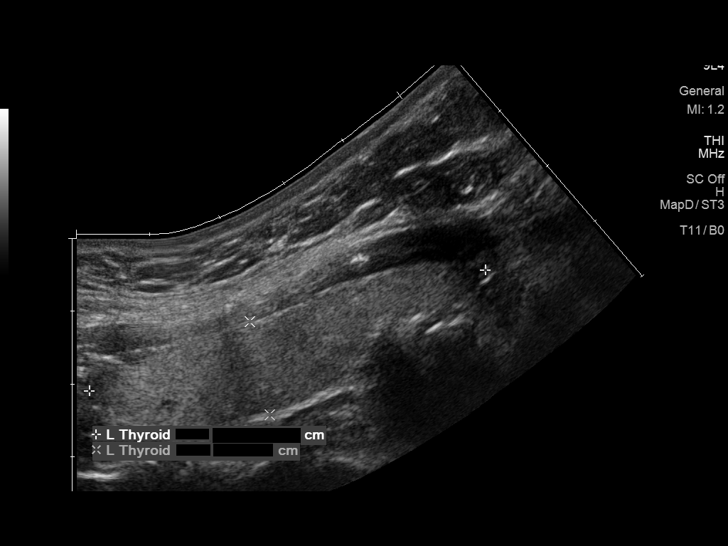
[im 27/44]
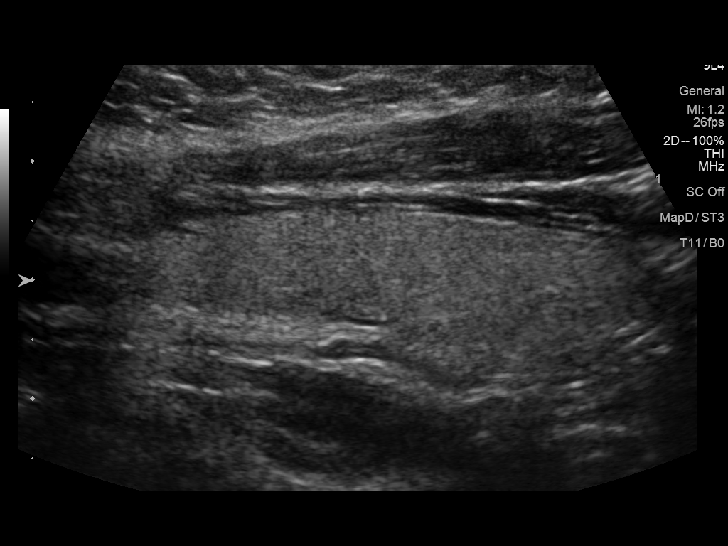
[im 29/44]
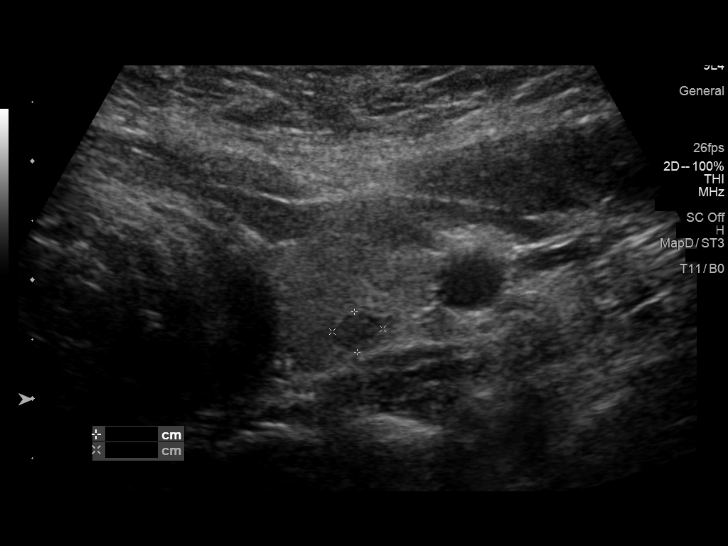
[im 33/44]
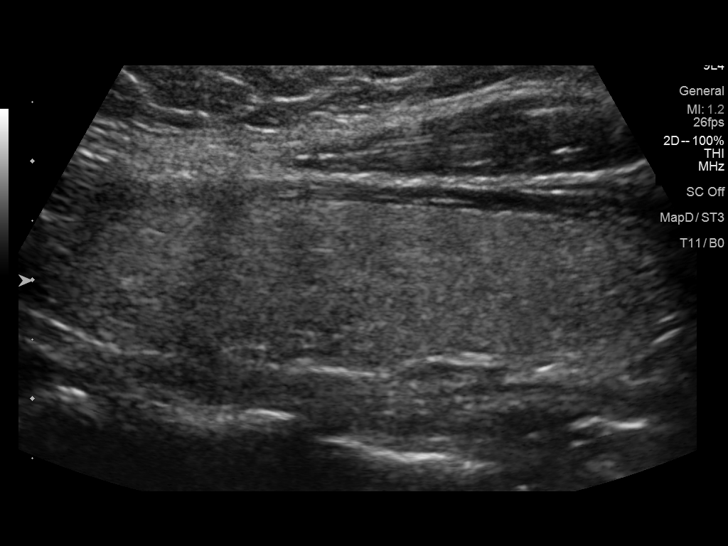
[im 36/44]
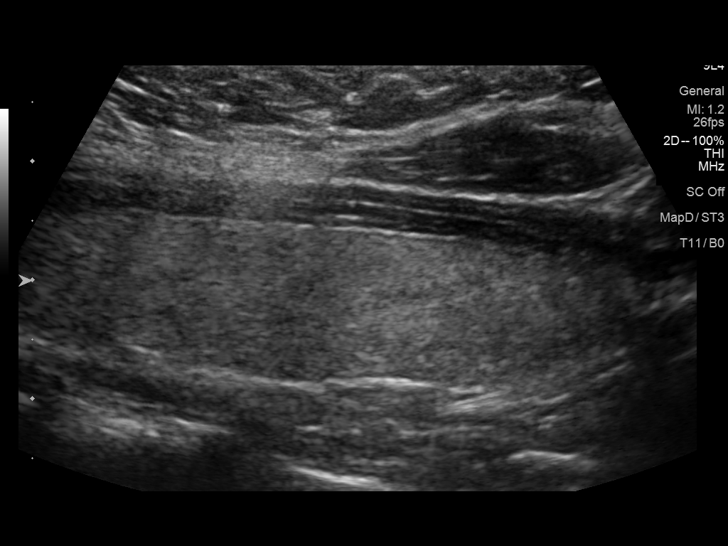
[im 40/44]
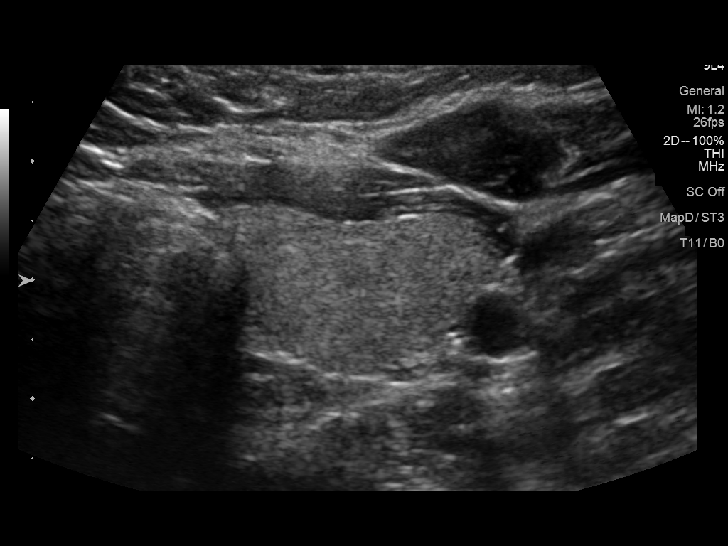
[im 44/44]
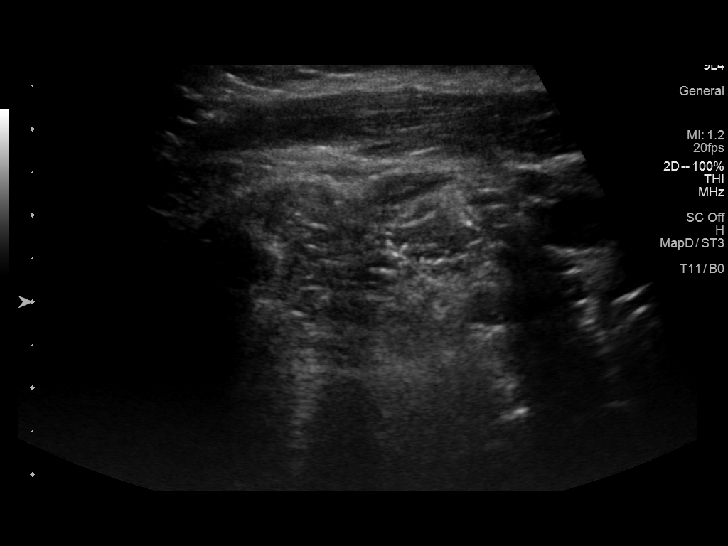

[14 of 25 positions shown; findings below may reference images not displayed]

FINDINGS: Parenchymal Echotexture: Mildly heterogenous

Isthmus: 0.3 cm

Right lobe: 5.8 x 1.5 x 2.3 cm

Left lobe: 5.7 x 1.3 x 2.3 cm

_________________________________________________________

Estimated total number of nodules >/= 1 cm: 0

Number of spongiform nodules >/=  2 cm not described below (TR1): 0

Number of mixed cystic and solid nodules >/= 1.5 cm not described
below (TR2): 0

_________________________________________________________

Small subcentimeter thyroid nodule in the posterior aspect of the
left upper lobe remains unchanged dating back to 5365. No new or
suspicious thyroid nodules identified.
IMPRESSION: Greater than 5 year stability of a small subcentimeter thyroid
nodule in the left superior gland. Greater than 5 year stability is
consistent with benignity. No further follow-up recommended.

The above is in keeping with the ACR TI-RADS recommendations - [HOSPITAL] 8640;[DATE].

## 2021-10-14 ENCOUNTER — Ambulatory Visit (INDEPENDENT_AMBULATORY_CARE_PROVIDER_SITE_OTHER): Payer: No Typology Code available for payment source | Admitting: Family Medicine

## 2021-10-14 ENCOUNTER — Other Ambulatory Visit: Payer: Self-pay

## 2021-10-14 ENCOUNTER — Encounter: Payer: Self-pay | Admitting: Family Medicine

## 2021-10-14 VITALS — BP 118/78 | HR 80 | Temp 97.8°F | Ht 63.5 in | Wt 194.2 lb

## 2021-10-14 DIAGNOSIS — Z1211 Encounter for screening for malignant neoplasm of colon: Secondary | ICD-10-CM

## 2021-10-14 DIAGNOSIS — R42 Dizziness and giddiness: Secondary | ICD-10-CM

## 2021-10-14 DIAGNOSIS — Z862 Personal history of diseases of the blood and blood-forming organs and certain disorders involving the immune mechanism: Secondary | ICD-10-CM

## 2021-10-14 DIAGNOSIS — J302 Other seasonal allergic rhinitis: Secondary | ICD-10-CM | POA: Insufficient documentation

## 2021-10-14 DIAGNOSIS — Z13220 Encounter for screening for lipoid disorders: Secondary | ICD-10-CM

## 2021-10-14 DIAGNOSIS — E049 Nontoxic goiter, unspecified: Secondary | ICD-10-CM | POA: Diagnosis not present

## 2021-10-14 DIAGNOSIS — Z23 Encounter for immunization: Secondary | ICD-10-CM | POA: Diagnosis not present

## 2021-10-14 DIAGNOSIS — F419 Anxiety disorder, unspecified: Secondary | ICD-10-CM

## 2021-10-14 NOTE — Progress Notes (Signed)
[facility] [address] [place] [address] Dept: [phone] Dept Fax: [phone]  New Patient Office Visit  Subjective:    Patient ID: Shannon Mccarty, female    DOB: [birth year], 46 y.o..   MRN: [id number]  Chief Complaint  Patient presents with   Establish Care    NP- establish care.  C/o having some dizzy spells  and would like to get the flu shot today      History of Present Illness:  Patient is in today to establish care. Ms. Brecheisen was born in [place] and grew up in [place], [place]. She attended college at [employer], majoring in [employer]. She taught high school English for 7 years. Her husband is a PA. They eventually settled in [place] in 2009 for work. They have been married for 23 years. They have three children, two daughters (16, 63) and a son (9). They are members of the [address] [place] [address] [employer] [place] [place]. As such, she does not use tobacco, alcohol, or drugs.  Ms. Covington has a history of thyroid nodules and a multinodular goiter. Her ultrasounds have been stable. so she does not need routine follow-up for this.  Ms. Fair has a history of an abnormal pap smear (CIN I). She has a pending appointment with a gynecologist. She does have a Mirena IUD present, placed 3-4 years ago.  Ms. 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 She is on Wellbutrin for this.  Ms. Dubow notes a 2 year history of intermittent episodes of vertigo. She notes these are brief episodes where she does have a spinning or dysequilibrium sensation. One recently occurred while driving. It was associated with visual blurring. She notes it lasted 2 secs. She states that she often goes 2 months between episodes. she has not had any chages in hearing and denies tinnitus. She has had no sign of ear infection.   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   Obesity (BMI 30.0-34.9) 08/24/2019   History of anal fissures 07/05/2012   Left Pyelectasis - mild  04/08/2012   CIN I (cervical intraepithelial neoplasia I) 02/19/2012   Thyroid nodule 01/08/2012   Past Surgical History:  Procedure Laterality Date   DILATION AND EVACUATION  06/30/2011   Procedure: DILATATION AND EVACUATION (D&E);  Surgeon: Purcell Nails, MD;  Location: [facility];  Service: Gynecology;  Laterality: N/A;   WISDOM TOOTH EXTRACTION  2007   Family History  Problem Relation Age of Onset   Cancer Mother        Thyroid   Peripheral vascular disease Mother    Thyroid disease Mother    Asthma Daughter        as younger resolved now   Alzheimer's disease Maternal Grandmother    Outpatient Medications Prior to Visit  Medication Sig Dispense Refill   acetaminophen (TYLENOL) 500 MG tablet Take 500 mg by mouth every 6 (six) hours as needed. For headache      buPROPion (WELLBUTRIN XL) 300 MG 24 hr tablet Take 300 mg by mouth daily.     Cholecalciferol 1.25 MG (50000 UT) capsule cholecalciferol (vitamin D3) 1,250 mcg (50,000 unit) capsule  TAKE 1 CAPSULE WEEKLY X 8 WEEKS     levonorgestrel (MIRENA, 52 MG,) 20 MCG/DAY IUD Mirena 21 mcg/24 hours (8 yrs) 52 mg intrauterine  device  Take 1 device every day by intrauterine route.     Loratadine (CLARITIN PO) Take by mouth daily.     polyethylene glycol powder (GLYCOLAX/MIRALAX) 17 GM/SCOOP powder Take by mouth.     docusate sodium (COLACE) 100 MG capsule Take 100 mg by mouth 2 (two) times daily.     Fe Cbn-Fe Gluc-FA-B12-C-DSS (FERRALET 90) 90-1 MG TABS Take 1 capsule by mouth 2 (two) times daily with a meal. 30 each 2   folic acid (FOLVITE) 1 MG tablet Take 1 mg by mouth daily.     hydrocortisone-pramoxine (PROCTOFOAM HC) rectal foam Place 1 applicator rectally 2 (two) times daily. 10 g 2   Prenatal Vit-Fe Fumarate-FA (PRENATAL MULTIVITAMIN)  TABS Take 1 tablet by mouth daily.     No facility-administered medications prior to visit.   No Known Allergies    Objective:   Today's Vitals   10/14/21 1441  BP: 118/78  Pulse: 80  Temp: 97.8 F (36.6 C)  TempSrc: Temporal  SpO2: 97%  Weight: 194 lb 3.2 oz (88.1 kg)  Height: 5' 3.5" (1.613 m)   Body mass index is 33.86 kg/m.   General: Well developed, well nourished. No acute distress. HEENT: Normocephalic, non-traumatic. PERRL, EOMI. Conjunctiva clear. Fundiscopic exam shows normal   disc and vasculature. External ears normal. EAC and TMs normal bilaterally. Nose    clear without congestion or rhinorrhea. Mucous membranes moist. Oropharynx clear. Good dentition. Neck: Supple. No lymphadenopathy. No thyromegaly. Lungs: Clear to auscultation bilaterally. No wheezing, rales or rhonchi. CV: RRR without murmurs or rubs. Pulses 2+ bilaterally. Neuro: CN II-XII intact. No focal neurological deficits. Psych: Alert and oriented. Normal mood and affect.  Health Maintenance Due  Topic Date Due   Hepatitis C Screening  Never done   PAP SMEAR-Modifier  05/05/2015   COVID-19 Vaccine (3 - Booster for Pfizer series) 04/08/2020   COLONOSCOPY (Pts 45-29yrs Insurance coverage will need to be confirmed)  Never done   INFLUENZA VACCINE  04/21/2021     Imaging: Thyroid US (02/21/2020) IMPRESSION: Greater than 5 year stability of a small subcentimeter thyroid nodule in the left superior gland. Greater than 5 year stability is consistent with benignity. No further follow-up recommended.   The above is in keeping with the ACR TI-RADS recommendations - J Am Coll Radiol 2017;14:587-595.  Assessment & Plan:   1. Non-toxic goiter In light of the persistent episodic vertigo, I will check her TSH today.  - TSH; Future  2. Anxiety Stable on bupropion. We will continue this.  3. Vertigo I will check some additional screening. Because of the visual component of the vertigo, I would like her  to see a neurologist to evaluate for other potential central causes of these episodes. Due tot he short duration and long interval between attacks, I do not feel a medication would be helpful.  - Comprehensive metabolic panel; Future - Ambulatory referral to Neurology  4. History of anemia We will check a CBC to screen for persistent anemia.  - CBC; Future  5. Screening for lipid disorders  - Lipid panel; Future  6. Screening for colon cancer  - Ambulatory referral to Gastroenterology  7. Need for influenza vaccination  - Flu Vaccine QUAD 6+ mos PF IM (Fluarix Quad PF)   Loyola Mast, MD

## 2021-10-15 ENCOUNTER — Encounter: Payer: Self-pay | Admitting: Neurology

## 2021-10-16 ENCOUNTER — Other Ambulatory Visit (INDEPENDENT_AMBULATORY_CARE_PROVIDER_SITE_OTHER): Payer: No Typology Code available for payment source

## 2021-10-16 ENCOUNTER — Other Ambulatory Visit: Payer: Self-pay

## 2021-10-16 DIAGNOSIS — Z862 Personal history of diseases of the blood and blood-forming organs and certain disorders involving the immune mechanism: Secondary | ICD-10-CM

## 2021-10-16 DIAGNOSIS — E049 Nontoxic goiter, unspecified: Secondary | ICD-10-CM | POA: Diagnosis not present

## 2021-10-16 DIAGNOSIS — Z13220 Encounter for screening for lipoid disorders: Secondary | ICD-10-CM | POA: Diagnosis not present

## 2021-10-16 DIAGNOSIS — R42 Dizziness and giddiness: Secondary | ICD-10-CM | POA: Diagnosis not present

## 2021-10-16 LAB — COMPREHENSIVE METABOLIC PANEL
ALT: 6 U/L (ref 0–35)
AST: 19 U/L (ref 0–37)
Albumin: 4.5 g/dL (ref 3.5–5.2)
Alkaline Phosphatase: 56 U/L (ref 39–117)
BUN: 12 mg/dL (ref 6–23)
CO2: 30 mEq/L (ref 19–32)
Calcium: 9.6 mg/dL (ref 8.4–10.5)
Chloride: 103 mEq/L (ref 96–112)
Creatinine, Ser: 0.89 mg/dL (ref 0.40–1.20)
GFR: 78.07 mL/min (ref >60.00)
Glucose, Bld: 94 mg/dL (ref 70–99)
Potassium: 4.1 mEq/L (ref 3.5–5.1)
Sodium: 140 mEq/L (ref 135–145)
Total Bilirubin: 0.9 mg/dL (ref 0.2–1.2)
Total Protein: 7.1 g/dL (ref 6.0–8.3)

## 2021-10-16 LAB — LIPID PANEL
Cholesterol: 161 mg/dL (ref 0–200)
HDL: 65.9 mg/dL (ref >39.00)
LDL Cholesterol: 83 mg/dL (ref 0–99)
NonHDL: 95.04
Total CHOL/HDL Ratio: 2
Triglycerides: 60 mg/dL (ref 0.0–149.0)
VLDL: 12 mg/dL (ref 0.0–40.0)

## 2021-10-16 LAB — TSH: TSH: 0.76 u[IU]/mL (ref 0.35–5.50)

## 2021-10-16 LAB — CBC
HCT: 41.5 % (ref 36.0–46.0)
Hemoglobin: 13.9 g/dL (ref 12.0–15.0)
MCHC: 33.4 g/dL (ref 30.0–36.0)
MCV: 91.5 fl (ref 78.0–100.0)
Platelets: 209 10*3/uL (ref 150.0–400.0)
RBC: 4.54 Mil/uL (ref 3.87–5.11)
RDW: 13.4 % (ref 11.5–15.5)
WBC: 5.8 10*3/uL (ref 4.0–10.5)

## 2021-12-11 ENCOUNTER — Ambulatory Visit: Payer: No Typology Code available for payment source | Admitting: Neurology

## 2022-01-06 ENCOUNTER — Ambulatory Visit: Payer: No Typology Code available for payment source | Admitting: Neurology

## 2022-01-12 ENCOUNTER — Ambulatory Visit (INDEPENDENT_AMBULATORY_CARE_PROVIDER_SITE_OTHER): Payer: No Typology Code available for payment source | Admitting: Family Medicine

## 2022-01-12 VITALS — BP 122/76 | HR 68 | Temp 97.3°F | Ht 63.5 in | Wt 198.2 lb

## 2022-01-12 DIAGNOSIS — J301 Allergic rhinitis due to pollen: Secondary | ICD-10-CM | POA: Diagnosis not present

## 2022-01-12 DIAGNOSIS — F419 Anxiety disorder, unspecified: Secondary | ICD-10-CM | POA: Diagnosis not present

## 2022-01-12 DIAGNOSIS — E049 Nontoxic goiter, unspecified: Secondary | ICD-10-CM

## 2022-01-12 NOTE — Progress Notes (Signed)
?[facility] ?[address] ?[place] [address] ?Dept: [phone] ?Dept Fax: [phone] ? ?Chronic Care Office Visit ? ?Subjective:  ? ? Patient ID: Shannon Mccarty, female    DOB: 04/25/1976, 46 y.o..   MRN: [id number] ? ?Chief Complaint  ?Patient presents with  ? Follow-up  ?  3 month f/u.  No concerns.  Not fasting.   ? ? ?History of Present Illness: ? ?Patient is in today for reassessment of chronic medical issues. ? ?Ms. Makepeace has a history of postpartum anxiety that had been persistent. She was being managed on Wellbutrin for this, but had some dizziness symptoms and some concerns about pending menopause. Her GYN tapered her off of Wellbutrin and started her on Lexapro. She noted a period of fatigue during the transition, but after 2 weeks, felt much better and now feels she is doing well. ? ?Ms. Gwynne has a history of thyroid nodules and a multinodular goiter. We did a TSH testing at her last visit. ?  ?Ms. Ledesma has a history of an abnormal pap smear (CIN I). She has a recent pap smear with her gynecologist. This was normal. She does have a Mirena IUD present, placed 3-4 years ago. ?  ?Past Medical History: ?Patient Active Problem List  ? Diagnosis Date Noted  ? Seasonal allergic rhinitis 10/14/2021  ? Anxiety 10/14/2021  ? 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 Obesity (BMI 30.0-34.9) 08/24/2019  ? History of anal fissures 07/05/2012  ? Left Pyelectasis - mild  04/08/2012  ? CIN I (cervical intraepithelial neoplasia I) 02/19/2012  ? Thyroid nodule 01/08/2012  ? ?Past Surgical History:  ?Procedure Laterality Date  ? DILATION AND EVACUATION  06/30/2011  ? Procedure: DILATATION AND EVACUATION (D&E);  Surgeon: Purcell Nails, MD;  Location: [facility];  Service: Gynecology;  Laterality: N/A;  ? WISDOM TOOTH EXTRACTION  2007  ? ?Family History  ?Problem Relation Age of Onset  ? Cancer Mother   ?     Thyroid  ? Peripheral vascular disease Mother   ? Thyroid disease  Mother   ? Asthma Daughter   ?     as younger resolved now  ? Alzheimer's disease Maternal Grandmother   ? ?Outpatient Medications Prior to Visit  ?Medication Sig Dispense Refill  ? acetaminophen (TYLENOL) 500 MG tablet Take 500 mg by mouth every 6 (six) hours as needed. For headache     ? cetirizine (ZYRTEC) 10 MG tablet 1 tablet    ? Cholecalciferol 1.25 MG (50000 UT) capsule cholecalciferol (vitamin D3) 1,250 mcg (50,000 unit) capsule ? TAKE 1 CAPSULE WEEKLY X 8 WEEKS    ? escitalopram (LEXAPRO) 10 MG tablet Take 10 mg by mouth daily.    ? levonorgestrel (MIRENA, 52 MG,) 20 MCG/DAY IUD Mirena 21 mcg/24 hours (8 yrs) 52 mg intrauterine device ? Take 1 device every day by intrauterine route.    ? Loratadine (CLARITIN PO) Take by mouth daily.    ? polyethylene glycol powder (GLYCOLAX/MIRALAX) 17 GM/SCOOP powder Take by mouth.    ? buPROPion (WELLBUTRIN XL) 300 MG 24 hr tablet Take 300 mg by mouth daily.    ? ?No facility-administered medications prior to visit.  ? ?No Known Allergies ?   ?Objective:  ? ?Today's Vitals  ? 01/12/22 1056  ?BP: 122/76  ?Pulse: 68  ?Temp: (!) 97.3 ?F (36.3 ?C)  ?TempSrc: Temporal  ?SpO2: 99%  ?Weight: 198 lb 3.2 oz (89.9 kg)  ?Height: 5' 3.5" (1.613 m)  ? ?  Body mass index is 34.56 kg/m?.  ? ?General: Well developed, well nourished. No acute distress. ?HEENT: Normocephalic, non-traumatic. External ears normal. EAC and TMs normal bilaterally. PERRL, EOMI. Conjunctiva clear. Fundiscopic exam shows normal disc and vasculature. Nose   ?clear without congestion or rhinorrhea. Mucous membranes moist. Oropharynx clear. Good dentition. ?Neck: Supple. No lymphadenopathy. No thyromegaly. ?Lungs: Clear to auscultation bilaterally. No wheezing, rales or rhonchi. ?CV: RRR without murmurs or rubs. Pulses 2+ bilaterally. ?Abdomen: Soft, non-tender. Bowel sounds positive, normal pitch and frequency. No hepatosplenomegaly. No rebound or guarding. ?Back: Straight. No CVA tenderness  bilaterally. ?Extremities: Full ROM. No joint swelling or tenderness. No edema noted. ?Skin: Warm and dry. No rashes. ?Neuro: CN II-XII intact. Normal sensation and DTR bilaterally. ?Psych: Alert and oriented. Normal mood and affect. ? ?Health Maintenance Due  ?Topic Date Due  ? Hepatitis C Screening  Never done  ? PAP SMEAR-Modifier  05/05/2015  ? COVID-19 Vaccine (3 - Pfizer risk series) 03/11/2020  ? COLONOSCOPY (Pts 45-60yrs Insurance coverage will need to be confirmed)  Never done  ? ?Lab Results ?Last CBC ?Lab Results  ?Component Value Date  ? WBC 5.8 10/16/2021  ? HGB 13.9 10/16/2021  ? HCT 41.5 10/16/2021  ? MCV 91.5 10/16/2021  ? MCH 27.0 05/25/2012  ? RDW 13.4 10/16/2021  ? PLT 209.0 10/16/2021  ? ?Last lipids ?Lab Results  ?Component Value Date  ? CHOL 161 10/16/2021  ? HDL 65.90 10/16/2021  ? LDLCALC 83 10/16/2021  ? TRIG 60.0 10/16/2021  ? CHOLHDL 2 10/16/2021  ? ?Last thyroid functions ?Lab Results  ?Component Value Date  ? TSH 0.76 10/16/2021  ? ? ?  Latest Ref Rng & Units 10/16/2021  ?  9:56 AM 03/18/2012  ?  8:30 AM  ?CMP  ?Glucose 70 - 99 mg/dL 94   90    ?BUN 6 - 23 mg/dL 12     ?Creatinine 0.40 - 1.20 mg/dL 5.28     ?Sodium 135 - 145 mEq/L 140     ?Potassium 3.5 - 5.1 mEq/L 4.1     ?Chloride 96 - 112 mEq/L 103     ?CO2 19 - 32 mEq/L 30     ?Calcium 8.4 - 10.5 mg/dL 9.6     ?Total Protein 6.0 - 8.3 g/dL 7.1     ?Total Bilirubin 0.2 - 1.2 mg/dL 0.9     ?Alkaline Phos 39 - 117 U/L 56     ?AST 0 - 37 U/L 19     ?ALT 0 - 35 U/L 6     ?   ?Assessment & Plan:  ? ?1. Anxiety ?Stable on Lexapro 10 mg daily. We will plan to continue this. ? ?2. Non-toxic goiter ?TSH normal. Thyroid ultrasounds have been normal. No need for further follow up, unless patient develops symptoms. ? ?3. Seasonal allergic rhinitis due to pollen ?Having mild seasonal allergy flare. Continue antihistamine (Zyrtec currently). May add a nasal steroid if needed. ? ?Return in about 6 months (around 07/14/2022) for Reassessment.   ? ?Loyola Mast, MD ?

## 2022-01-21 ENCOUNTER — Ambulatory Visit (AMBULATORY_SURGERY_CENTER): Payer: No Typology Code available for payment source

## 2022-01-21 VITALS — Ht 63.5 in | Wt 195.0 lb

## 2022-01-21 DIAGNOSIS — Z1211 Encounter for screening for malignant neoplasm of colon: Secondary | ICD-10-CM

## 2022-01-21 MED ORDER — CLENPIQ 10-3.5-12 MG-GM -GM/160ML PO SOLN: 1.0000 | ORAL | 0 refills | Status: DC

## 2022-01-21 NOTE — Progress Notes (Signed)
No egg or soy allergy known to patient  ?No issues known to pt with past sedation with any surgeries or procedures ?Patient denies ever being told they had issues or difficulty with intubation  ?No FH of Malignant Hyperthermia ?Pt is not on diet pills ?Pt is not on home 02  ?Pt is not on blood thinners  ?Pt denies issues with constipation at this time;  ?No A fib or A flutter ?Clenpiq coupon to pt in PV today and NO PA's for preps discussed with pt in PV today  ?Discussed with pt there will be an out-of-pocket cost for prep and that varies from $0 to 70 + dollars - pt verbalized understanding  ?Pt instructed to use Singlecare.com or GoodRx for a price reduction on prep  ?PV completed over the phone. Pt verified name, DOB, address and insurance during PV today.  ?Pt mailed instruction packet with copy of consent form to read and not return, and instructions.  ?Pt encouraged to call with questions or issues.  ? ?Insurance confirmed with pt at [facility] today  ? ?

## 2022-02-12 ENCOUNTER — Encounter: Payer: Self-pay | Admitting: Gastroenterology

## 2022-02-17 ENCOUNTER — Encounter: Payer: Self-pay | Admitting: Gastroenterology

## 2022-02-17 ENCOUNTER — Ambulatory Visit (AMBULATORY_SURGERY_CENTER): Payer: No Typology Code available for payment source | Admitting: Gastroenterology

## 2022-02-17 VITALS — BP 112/63 | HR 55 | Temp 97.8°F | Resp 9 | Ht 63.5 in | Wt 195.0 lb

## 2022-02-17 DIAGNOSIS — Z8371 Family history of colonic polyps: Secondary | ICD-10-CM

## 2022-02-17 DIAGNOSIS — Z800 Family history of malignant neoplasm of digestive organs: Secondary | ICD-10-CM

## 2022-02-17 DIAGNOSIS — Z1211 Encounter for screening for malignant neoplasm of colon: Secondary | ICD-10-CM | POA: Diagnosis present

## 2022-02-17 MED ORDER — SODIUM CHLORIDE 0.9 % IV SOLN: 500.0000 mL | INTRAVENOUS | Status: DC

## 2022-02-17 NOTE — Op Note (Signed)
[facility] Patient Name: Shannon Mccarty Procedure Date: 02/17/2022 10:23 AM MRN: [id number] Endoscopist: Doristine Locks , MD Age: 46 Referring MD:  Date of Birth: Feb 12, 1976 Gender: Female Account #: [id number] Procedure:                Colonoscopy Indications:              Screening for colorectal malignant neoplasm, This                            is the patient's first colonoscopy                           Family history notable for maternal grandfather and                            maternal grandmother with colon cancer and paternal                            grandfather with colon polyps. Otherwise, no active                            GI symptoms. Medicines:                Monitored Anesthesia Care Procedure:                Pre-Anesthesia Assessment:                           - Prior to the procedure, a History and Physical                            was performed, and patient medications and                            allergies were reviewed. The patient's tolerance of                            previous anesthesia was also reviewed. The risks                            and benefits of the procedure and the sedation                            options and risks were discussed with the patient.                            All questions were answered, and informed consent                            was obtained. Prior Anticoagulants: The patient has                            taken no previous anticoagulant or antiplatelet  agents. ASA Grade Assessment: II - A patient with                            mild systemic disease. After reviewing the risks                            and benefits, the patient was deemed in                            satisfactory condition to undergo the procedure.                           After obtaining informed consent, the colonoscope                            was passed under direct vision. Throughout the                             procedure, the patient's blood pressure, pulse, and                            oxygen saturations were monitored continuously. The                            CF [id number] #[id number] was introduced through the anus                            and advanced to the the terminal ileum. The                            colonoscopy was performed without difficulty. The                            patient tolerated the procedure well. The quality                            of the bowel preparation was good. The terminal                            ileum, ileocecal valve, appendiceal orifice, and                            rectum were photographed. Scope In: 10:48:17 AM Scope Out: 11:04:39 AM Scope Withdrawal Time: 0 hours 10 minutes 29 seconds  Total Procedure Duration: 0 hours 16 minutes 22 seconds  Findings:                 The perianal and digital rectal examinations were                            normal.                           The entire colon appeared normal.  The retroflexed view of the distal rectum and anal                            verge was normal and showed no anal or rectal                            abnormalities.                           The terminal ileum appeared normal. Complications:            No immediate complications. Estimated Blood Loss:     Estimated blood loss: none. Impression:               - The entire examined colon is normal.                           - The distal rectum and anal verge are normal on                            retroflexion view.                           - The examined portion of the ileum was normal.                           - No specimens collected. Recommendation:           - Patient has a contact number available for                            emergencies. The signs and symptoms of potential                            delayed complications were discussed with the                            patient. Return  to normal activities tomorrow.                            Written discharge instructions were provided to the                            patient.                           - Resume previous diet.                           - Continue present medications.                           - Repeat colonoscopy in 10 years for screening                            purposes.                           -  Return to GI office PRN. Doristine Locks, MD 02/17/2022 11:08:51 AM

## 2022-02-17 NOTE — Progress Notes (Signed)
GASTROENTEROLOGY PROCEDURE H&P NOTE   Primary Care Physician: Haydee Salter, MD    Reason for Procedure:  Colon Cancer screening  Plan:    Colonoscopy  Patient is appropriate for endoscopic procedure(s) in the ambulatory ([place]) setting.  The nature of the procedure, as well as the risks, benefits, and alternatives were carefully and thoroughly reviewed with the patient. Ample time for discussion and questions allowed. The patient understood, was satisfied, and agreed to proceed.     HPI: Shannon Mccarty is a 46 y.o. female who presents for colonoscopy for routine Colon Cancer screening.  No active GI symptoms.    Fhx n/f maternal GF and GM with CRC and paternal GF with polyps  Past Medical History:  Diagnosis Date   Abnormal Pap smear    AMA (advanced maternal age) multigravida 35+    Anemia    Anxiety    on meds   Depression    on meds   H/O cystitis     x 1   H/O varicella    History of constipation    History of hemorrhoids    NSVD (normal spontaneous vaginal delivery) 05/24/2012   PRB (rectal bleeding)    Preterm labor    Rapid first stage of labor 05/24/2012   Rectal pain    Seasonal allergies    Thyroid nodule 09/22/2007    Past Surgical History:  Procedure Laterality Date   DILATION AND EVACUATION  06/30/2011   Procedure: DILATATION AND EVACUATION (D&E);  Surgeon: Delice Lesch, MD;  Location: [facility];  Service: Gynecology;  Laterality: N/A;   [place] EXTRACTION  2007    Prior to Admission medications   Medication Sig Start Date End Date Taking? Authorizing Provider  acetaminophen (TYLENOL) 500 MG tablet Take 500 mg by mouth every 6 (six) hours as needed. For headache    Yes [provider]  cetirizine (ZYRTEC) 10 MG tablet Take 10 mg by mouth daily.   Yes [provider]  escitalopram (LEXAPRO) 10 MG tablet Take 10 mg by mouth daily. 12/18/21  Yes [provider]  levonorgestrel (MIRENA, 52 MG,) 20 MCG/DAY IUD Mirena  21 mcg/24 hours (8 yrs) 52 mg intrauterine device  Take 1 device every day by intrauterine route. 09/27/12   [provider]  polyethylene glycol powder (GLYCOLAX/MIRALAX) 17 GM/SCOOP powder Take 1 g/kg by mouth daily. Patient not taking: Reported on 02/17/2022    [provider]  VITAMIN D PO Take 1 tablet by mouth daily at 6 (six) AM.    [provider]    Current Outpatient Medications  Medication Sig Dispense Refill   acetaminophen (TYLENOL) 500 MG tablet Take 500 mg by mouth every 6 (six) hours as needed. For headache      cetirizine (ZYRTEC) 10 MG tablet Take 10 mg by mouth daily.     escitalopram (LEXAPRO) 10 MG tablet Take 10 mg by mouth daily.     levonorgestrel (MIRENA, 52 MG,) 20 MCG/DAY IUD Mirena 21 mcg/24 hours (8 yrs) 52 mg intrauterine device  Take 1 device every day by intrauterine route.     polyethylene glycol powder (GLYCOLAX/MIRALAX) 17 GM/SCOOP powder Take 1 g/kg by mouth daily. (Patient not taking: Reported on 02/17/2022)     VITAMIN D PO Take 1 tablet by mouth daily at 6 (six) AM.     Current Facility-Administered Medications  Medication Dose Route Frequency Provider Last Rate Last Admin   0.9 %  sodium chloride infusion  500  mL Intravenous Continuous Saran Laviolette V, DO        Allergies as of 02/17/2022   (No Known Allergies)    Family History  Problem Relation Age of Onset   Cancer Mother        Thyroid   Peripheral vascular disease Mother    Thyroid disease Mother    Alzheimer's disease Maternal Grandmother    Colon polyps Paternal Grandfather 74   Asthma Daughter        as younger resolved now   Esophageal cancer Neg Hx    Rectal cancer Neg Hx    Stomach cancer Neg Hx     Social History   Socioeconomic History   Marital status: Married    Spouse name: Not on file   Number of children: 3   Years of education: Not on file   Highest education level: Bachelor's degree (e.g., BA, AB, BS)  Occupational History   Not on  file  Tobacco Use   Smoking status: Never   Smokeless tobacco: Never  Vaping Use   Vaping Use: Never used  Substance and Sexual Activity   Alcohol use: No   Drug use: No   Sexual activity: Yes    Birth control/protection: I.U.D.    Comment: MIRENA  Other Topics Concern   Not on file  Social History Narrative   Not on file   Social Determinants of Health   Financial Resource Strain: Not on file  Food Insecurity: Not on file  Transportation Needs: Not on file  Physical Activity: Not on file  Stress: Not on file  Social Connections: Not on file  Intimate Partner Violence: Not on file    Physical Exam: Vital signs in last 24 hours: @BP  108/66   Pulse 72   Temp 97.8 F (36.6 C)   Ht 5' 3.5" (1.613 m)   Wt 195 lb (88.5 kg)   SpO2 98%   BMI 34.00 kg/m  GEN: NAD EYE: Sclerae anicteric ENT: MMM CV: Non-tachycardic Pulm: CTA b/l GI: Soft, NT/ND NEURO:  Alert & Oriented x 3   Gerrit Heck, DO [facility]   02/17/2022 10:39 AM

## 2022-02-17 NOTE — Patient Instructions (Signed)
YOU HAD AN ENDOSCOPIC PROCEDURE TODAY AT THE [facility]:   Refer to the procedure report that was given to you for any specific questions about what was found during the examination.  If the procedure report does not answer your questions, please call your gastroenterologist to clarify.  If you requested that your care partner not be given the details of your procedure findings, then the procedure report has been included in a sealed envelope for you to review at your convenience later.  YOU SHOULD EXPECT: Some feelings of bloating in the abdomen. Passage of more gas than usual.  Walking can help get rid of the air that was put into your GI tract during the procedure and reduce the bloating. If you had a lower endoscopy (such as a colonoscopy or flexible sigmoidoscopy) you may notice spotting of blood in your stool or on the toilet paper. If you underwent a bowel prep for your procedure, you may not have a normal bowel movement for a few days.  Please Note:  You might notice some irritation and congestion in your nose or some drainage.  This is from the oxygen used during your procedure.  There is no need for concern and it should clear up in a day or so.  SYMPTOMS TO REPORT IMMEDIATELY:   Following lower endoscopy (colonoscopy or flexible sigmoidoscopy):  Excessive amounts of blood in the stool  Significant tenderness or worsening of abdominal pains  Swelling of the abdomen that is new, acute  Fever of 100F or higher  For urgent or emergent issues, a gastroenterologist can be reached at any hour by calling [phone]. Do not use MyChart messaging for urgent concerns.    DIET:  We do recommend a small meal at first, but then you may proceed to your regular diet.  Drink plenty of fluids but you should avoid alcoholic beverages for 24 hours.  ACTIVITY:  You should plan to take it easy for the rest of today and you should NOT DRIVE or use heavy machinery until tomorrow (because  of the sedation medicines used during the test).    FOLLOW UP: Our staff will call the number listed on your records 48-72 hours following your procedure to check on you and address any questions or concerns that you may have regarding the information given to you following your procedure. If we do not reach you, we will leave a message.  We will attempt to reach you two times.  During this call, we will ask if you have developed any symptoms of COVID 19. If you develop any symptoms (ie: fever, flu-like symptoms, shortness of breath, cough etc.) before then, please call [phone].  If you test positive for Covid 19 in the 2 weeks post procedure, please call and report this information to us.    If any biopsies were taken you will be contacted by phone or by letter within the next 1-3 weeks.  Please call us at [phone] if you have not heard about the biopsies in 3 weeks.    SIGNATURES/CONFIDENTIALITY: You and/or your care partner have signed paperwork which will be entered into your electronic medical record.  These signatures attest to the fact that that the information above on your After Visit Summary has been reviewed and is understood.  Full responsibility of the confidentiality of this discharge information lies with you and/or your care-partner. 

## 2022-02-17 NOTE — Progress Notes (Signed)
Pt non-responsive, VVS, Report to RN  °

## 2022-02-18 ENCOUNTER — Telehealth: Payer: Self-pay | Admitting: *Deleted

## 2022-02-18 NOTE — Telephone Encounter (Signed)
  Follow up Call-     02/17/2022    9:54 AM  Call back number  Post procedure Call Back phone  # [phone]  Permission to leave phone message Yes     Patient questions:  Do you have a fever, pain , or abdominal swelling? No. Pain Score  0 *  Have you tolerated food without any problems? Yes.    Have you been able to return to your normal activities? Yes.    Do you have any questions about your discharge instructions: Diet   No. Medications  No. Follow up visit  No.  Do you have questions or concerns about your Care? No.  Actions: * If pain score is 4 or above: No action needed, pain <4.

## 2022-11-23 DIAGNOSIS — Z975 Presence of (intrauterine) contraceptive device: Secondary | ICD-10-CM | POA: Insufficient documentation

## 2023-03-12 ENCOUNTER — Ambulatory Visit (INDEPENDENT_AMBULATORY_CARE_PROVIDER_SITE_OTHER): Payer: No Typology Code available for payment source | Admitting: Family Medicine

## 2023-03-12 ENCOUNTER — Encounter: Payer: Self-pay | Admitting: Family Medicine

## 2023-03-12 VITALS — BP 110/68 | HR 66 | Temp 98.4°F | Ht 63.5 in | Wt 210.8 lb

## 2023-03-12 DIAGNOSIS — M79672 Pain in left foot: Secondary | ICD-10-CM

## 2023-03-12 DIAGNOSIS — E049 Nontoxic goiter, unspecified: Secondary | ICD-10-CM

## 2023-03-12 DIAGNOSIS — R42 Dizziness and giddiness: Secondary | ICD-10-CM

## 2023-03-12 DIAGNOSIS — M79671 Pain in right foot: Secondary | ICD-10-CM

## 2023-03-12 DIAGNOSIS — R002 Palpitations: Secondary | ICD-10-CM

## 2023-03-12 DIAGNOSIS — E041 Nontoxic single thyroid nodule: Secondary | ICD-10-CM

## 2023-03-12 NOTE — Assessment & Plan Note (Signed)
I reassured Ms. Zeleznik that her ultrasound and labs have been normal. She still would like to discuss this with a new endocrinologist. She specifically requests to see Dr. Debara Pickett.

## 2023-03-12 NOTE — Assessment & Plan Note (Signed)
As above.

## 2023-03-12 NOTE — Progress Notes (Signed)
[facility] [address] [place] [address] Dept: [phone] Dept Fax: [phone]  Office Visit  Subjective:    Patient ID: Shannon Mccarty, female    DOB: [birth year], 47 y.o..   MRN: [id number]  Chief Complaint  Patient presents with   Referral Request    Endocrinologist, podiatrist, and cardiologist   History of Present Illness:  Patient is in today to discuss several issues.  Shannon Mccarty has a history of thyroid nodules and a non-toxic goiter. She has concerns abotu what other options she might have for management and requests a referral to a new endocrinologist.   Shannon Mccarty has an ongoing issue with bilateral foot pain. She  notes that it ahs gotten to where she can't walk barefoot without pain.  Shannon Mccarty has a history of intermittent episodes of lightheadedness. She cannot relate this to any specific activity or positional change. She has noted that this is associated with brief episodes of tachycardia. She notes these episodes occur every 2 weeks or so. She worries that as she approaches menopause, they may occur more often.  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   Obesity (BMI 30.0-34.9) 08/24/2019   History of anal fissures 07/05/2012   Left Pyelectasis - mild  04/08/2012   CIN I (cervical intraepithelial neoplasia I) 02/19/2012   Thyroid nodule 01/08/2012   Past Surgical History:  Procedure Laterality Date   DILATION AND EVACUATION  06/30/2011   Procedure: DILATATION AND EVACUATION (D&E);  Surgeon: Purcell Nails, MD;  Location: [facility];  Service: Gynecology;  Laterality: N/A;   WISDOM TOOTH EXTRACTION  2007   Family History  Problem Relation Age of Onset   Cancer Mother        Thyroid   Peripheral vascular disease Mother     Thyroid disease Mother    Alzheimer's disease Maternal Grandmother    Colon polyps Paternal Grandfather 52   Asthma Daughter        as younger resolved now   Esophageal cancer Neg Hx    Rectal cancer Neg Hx    Stomach cancer Neg Hx    Outpatient Medications Prior to Visit  Medication Sig Dispense Refill   acetaminophen (TYLENOL) 500 MG tablet Take 500 mg by mouth every 6 (six) hours as needed. For headache      cetirizine (ZYRTEC) 10 MG tablet Take 10 mg by mouth daily.     Cholecalciferol 1.25 MG (50000 UT) capsule Take 50,000 Units by mouth once a week.     escitalopram (LEXAPRO) 10 MG tablet Take 10 mg by mouth daily.     levonorgestrel (MIRENA, 52 MG,) 20 MCG/DAY IUD Mirena 21 mcg/24 hours (8 yrs) 52 mg intrauterine device  Take 1 device every day by intrauterine route.     polyethylene glycol powder (GLYCOLAX/MIRALAX) 17 GM/SCOOP powder Take 1 g/kg by mouth daily.     VITAMIN D PO Take 1 tablet by mouth daily at 6 (six) AM. (Patient not taking: Reported on 03/12/2023)     No facility-administered medications prior to visit.   No Known Allergies   Objective:   Today's Vitals   03/12/23 1404  BP: 110/68  Pulse: 66  Temp: 98.4 F (36.9 C)  SpO2: 97%  Weight: 210 lb 12.8 oz (95.6 kg)  Height: 5' 3.5" (  1.613 m)   Body mass index is 36.76 kg/m.   General: Well developed, well nourished. No acute distress. Neck: Visible enlargement of the lower neck due to thyroid enlargement. CV: RRR without murmurs or rubs. Pulses 2+ bilaterally. 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 Psych: Alert and oriented. Normal mood and affect.  Health Maintenance Due  Topic Date Due   Hepatitis C Screening  Never done   COVID-19 Vaccine (3 - Pfizer risk series) 03/11/2020   DTaP/Tdap/Td (2 - Td or Tdap) 05/25/2022   Imaging US Thyroid (02/21/2020) IMPRESSION: Greater than 5 year stability of a small subcentimeter thyroid nodule in the left superior gland. Greater than 5 year stability  is consistent with benignity. No further follow-up recommended.  Lab Results Component Ref Range & Units 1 yr ago 11 yr ago  TSH 0.35 - 5.50 uIU/mL 0.76 0.428 R      Assessment & Plan:   Problem List Items Addressed This Visit       Endocrine   Thyroid nodule    As above      Relevant Orders   Ambulatory referral to Endocrinology   Non-toxic goiter - Primary    I reassured Shannon Mccarty that her ultrasound and labs have been normal. She still would like to discuss this with a new endocrinologist. She specifically requests to see Dr. Debara Pickett.      Relevant Orders   Ambulatory referral to Endocrinology   Other Visit Diagnoses     Foot pain, bilateral       Exam looks consistent with planovalgus. She may benefit from orthotics. i will refer her to podiatry for assessment.   Relevant Orders   Ambulatory referral to Podiatry   Episodic lightheadedness       Etiology is unclear. With the association with palpitations, I will refer her to cardiolgoy. she woudl likely benefit from having a Zio patch.   Relevant Orders   Ambulatory referral to Cardiology   Intermittent palpitations       As above.   Relevant Orders   Ambulatory referral to Cardiology       Return in about 1 year (around 03/11/2024) for Reassessment.   Loyola Mast, MD

## 2023-06-01 ENCOUNTER — Encounter: Payer: Self-pay | Admitting: Internal Medicine

## 2023-06-01 ENCOUNTER — Ambulatory Visit: Payer: Self-pay | Attending: Internal Medicine | Admitting: Internal Medicine

## 2023-06-01 VITALS — Ht 63.0 in | Wt 212.2 lb

## 2023-06-01 DIAGNOSIS — F419 Anxiety disorder, unspecified: Secondary | ICD-10-CM

## 2023-06-01 NOTE — Patient Instructions (Signed)
Medication Instructions:   *If you need a refill on your cardiac medications before your next appointment, please call your pharmacy*   Lab Work:  If you have labs (blood work) drawn today and your tests are completely normal, you will receive your results only by: MyChart Message (if you have MyChart) OR A paper copy in the mail If you have any lab test that is abnormal or we need to change your treatment, we will call you to review the results.   Testing/Procedures:    Follow-Up: AS NEEDED  At [facility] you and your health needs are our priority.  As part of our continuing mission to provide you with exceptional heart care, we have created designated Provider Care Teams.  These Care Teams include your primary Cardiologist (physician) and Advanced Practice Providers (APPs -  Physician Assistants and Nurse Practitioners) who all work together to provide you with the care you need, when you need it.  We recommend signing up for the patient portal called "MyChart".  Sign up information is provided on this After Visit Summary.  MyChart is used to connect with patients for Virtual Visits (Telemedicine).  Patients are able to view lab/test results, encounter notes, upcoming appointments, etc.  Non-urgent messages can be sent to your provider as well.   To learn more about what you can do with MyChart, go to [email or web address].

## 2023-06-01 NOTE — Progress Notes (Signed)
Cardiology Office Note   Date:  06/01/2023   ID:  Shannon Mccarty, DOB 11/07/1975, MRN [id number]  PCP:  Loyola Mast, MD  Cardiologist:   Dietrich Pates, MD   Patient presents for evaluation of dizziness    History of Present Illness: Shannon Mccarty is a 47 y.o. female with a history of thyroid nodules  Followed by Vickii Penna   Has a hx of intermitt dizziness  Not associated to position change  Associated with brief tachycardia.   Occur every 2 wks  She thinks it may be associated to when she misses Lexapro   Feels like moving sideways quickly, feels tightness in L arm.   Gets tired quickly.    The pt says she gets sporadic heart racing  a couple times per month  Episodes last a couple min  Not associated with  dizziness  Episodes started about 1 to 2 years   Pt drinks  1 soda per day  Mtn Dew  Diet varies   Br:  Sometimes skips   Water   Lunch  Sandwich.   Chips  Leftovers Dinner:  Set designer and veggies    Drink  Mtn Dew at ll am Snacks:   Cashews, cheese (white [place])      Current Meds  Medication Sig   acetaminophen (TYLENOL) 500 MG tablet Take 500 mg by mouth every 6 (six) hours as needed. For headache    cetirizine (ZYRTEC) 10 MG tablet Take 10 mg by mouth daily.   Cholecalciferol 1.25 MG (50000 UT) capsule Take 50,000 Units by mouth once a week.   escitalopram (LEXAPRO) 10 MG tablet Take 10 mg by mouth daily.   levonorgestrel (MIRENA, 52 MG,) 20 MCG/DAY IUD Mirena 21 mcg/24 hours (8 yrs) 52 mg intrauterine device  Take 1 device every day by intrauterine route.   meloxicam (MOBIC) 15 MG tablet Take 15 mg by mouth daily.   polyethylene glycol powder (GLYCOLAX/MIRALAX) 17 GM/SCOOP powder Take 1 g/kg by mouth daily.   VITAMIN D PO Take 1 tablet by mouth daily at 6 (six) AM.     Allergies:   Patient has no known allergies.   Past Medical History:  Diagnosis Date   Abnormal Pap smear    AMA (advanced maternal age) multigravida 35+    Anemia    Anxiety    on meds    Depression    on meds   H/O cystitis     x 1   H/O varicella    History of constipation    History of hemorrhoids    NSVD (normal spontaneous vaginal delivery) 05/24/2012   PRB (rectal bleeding)    Preterm labor    Rapid first stage of labor 05/24/2012   Rectal pain    Seasonal allergies    Thyroid nodule 09/22/2007    Past Surgical History:  Procedure Laterality Date   DILATION AND EVACUATION  06/30/2011   Procedure: DILATATION AND EVACUATION (D&E);  Surgeon: Purcell Nails, MD;  Location: [facility];  Service: Gynecology;  Laterality: N/A;   WISDOM TOOTH EXTRACTION  2007     Social History:  The patient  reports that she has never smoked. She has never used smokeless tobacco. She reports that she does not drink alcohol and does not use drugs.   Family History:  The patient's family history includes Alzheimer's disease in her maternal grandmother; Asthma in her daughter; Cancer in her mother; Colon polyps (age of onset: 83) in her paternal grandfather;  Peripheral vascular disease in her mother; Thyroid disease in her mother.    ROS:  Please see the history of present illness. All other systems are reviewed and  Negative to the above problem except as noted.    PHYSICAL EXAM: VS:  Ht 5\' 3"  (1.6 m)   Wt 212 lb 3.2 oz (96.3 kg)   SpO2 97%   BMI 37.59 kg/m  \\  BPlaying  118/76  P 65   Sitting  126/80  P 66   Standing   108/72  P 82   Standing 4 min   120/76  P 70   GEN: Well nourished, well developed, in no acute distress  HEENT: normal  Neck: no JVD, carotid bruits Cardiac: RRR; no murmurs  No LE  edema  Respiratory:  clear to auscultation GI: soft, nontender,  No hepatomegaly  MS: no deformity Moving all extremities   Skin: warm and dry, no rash Neuro:  Strength and sensation are intact Psych: euthymic mood, full affect   EKG:  EKG is ordered today.  NSR 65 bpm    Lipid Panel    Component Value Date/Time   CHOL 161 10/16/2021 0956   TRIG 60.0 10/16/2021 0956    HDL 65.90 10/16/2021 0956   CHOLHDL 2 10/16/2021 0956   VLDL 12.0 10/16/2021 0956   LDLCALC 83 10/16/2021 0956      Wt Readings from Last 3 Encounters:  06/01/23 212 lb 3.2 oz (96.3 kg)  03/12/23 210 lb 12.8 oz (95.6 kg)  02/17/22 195 lb (88.5 kg)      ASSESSMENT AND PLAN:  1  Dizziness   Spells are unusual   I do not think orthostatic   Erratic  Association with missed dose of Lexapro is unusual   On exam, she is not orthostatic today   Stay hydrated    I will review with pharmacy pharmacokinetics.    2  Metabolics  Reviewed with patient   Recomm whole food, minimally processed, lots of veggies   Stop sodas/ ssbs        Current medicines are reviewed at length with the patient today.  The patient does not have concerns regarding medicines.  Signed, Dietrich Pates, MD  06/01/2023 10:35 PM    [facility] [address] [place], [place], [place]  [address] Phone: [phone]; Fax: [phone]
